# Patient Record
Sex: Female | Born: 1935
Health system: Southern US, Community
[De-identification: ages and names within clinical notes are randomized; demographics above are authoritative.]

## PROBLEM LIST (undated history)

## (undated) DIAGNOSIS — F32A Depression, unspecified: Secondary | ICD-10-CM

## (undated) DIAGNOSIS — R42 Dizziness and giddiness: Secondary | ICD-10-CM

## (undated) DIAGNOSIS — M1990 Unspecified osteoarthritis, unspecified site: Secondary | ICD-10-CM

## (undated) DIAGNOSIS — F0390 Unspecified dementia without behavioral disturbance: Secondary | ICD-10-CM

## (undated) DIAGNOSIS — E039 Hypothyroidism, unspecified: Secondary | ICD-10-CM

## (undated) DIAGNOSIS — I10 Essential (primary) hypertension: Secondary | ICD-10-CM

## (undated) DIAGNOSIS — R443 Hallucinations, unspecified: Secondary | ICD-10-CM

## (undated) DIAGNOSIS — J45909 Unspecified asthma, uncomplicated: Secondary | ICD-10-CM

## (undated) DIAGNOSIS — F329 Major depressive disorder, single episode, unspecified: Secondary | ICD-10-CM

## (undated) DIAGNOSIS — H3530 Unspecified macular degeneration: Secondary | ICD-10-CM

## (undated) DIAGNOSIS — E785 Hyperlipidemia, unspecified: Secondary | ICD-10-CM

## (undated) DIAGNOSIS — C50919 Malignant neoplasm of unspecified site of unspecified female breast: Secondary | ICD-10-CM

## (undated) HISTORY — PX: BREAST SURGERY: SHX581

## (undated) HISTORY — PX: LUMBAR DISC SURGERY: SHX700

## (undated) HISTORY — DX: Malignant neoplasm of unspecified site of unspecified female breast: C50.919

## (undated) HISTORY — DX: Hyperlipidemia, unspecified: E78.5

## (undated) HISTORY — DX: Essential (primary) hypertension: I10

## (undated) HISTORY — DX: Unspecified asthma, uncomplicated: J45.909

## (undated) HISTORY — DX: Unspecified macular degeneration: H35.30

## (undated) HISTORY — DX: Hallucinations, unspecified: R44.3

## (undated) HISTORY — PX: APPENDECTOMY: SHX54

## (undated) HISTORY — DX: Major depressive disorder, single episode, unspecified: F32.9

## (undated) HISTORY — DX: Dizziness and giddiness: R42

## (undated) HISTORY — DX: Depression, unspecified: F32.A

## (undated) HISTORY — DX: Hypothyroidism, unspecified: E03.9

## (undated) HISTORY — PX: ABDOMINAL HYSTERECTOMY: SHX81

---

## 2000-03-07 ENCOUNTER — Encounter: Payer: Self-pay | Admitting: *Deleted

## 2000-03-07 ENCOUNTER — Encounter: Admission: RE | Admit: 2000-03-07 | Discharge: 2000-03-07 | Payer: Self-pay | Admitting: *Deleted

## 2001-04-01 ENCOUNTER — Other Ambulatory Visit: Admission: RE | Admit: 2001-04-01 | Discharge: 2001-04-01 | Payer: Self-pay | Admitting: *Deleted

## 2001-04-03 ENCOUNTER — Encounter: Payer: Self-pay | Admitting: *Deleted

## 2001-04-03 ENCOUNTER — Encounter: Admission: RE | Admit: 2001-04-03 | Discharge: 2001-04-03 | Payer: Self-pay | Admitting: *Deleted

## 2002-06-02 ENCOUNTER — Encounter: Payer: Self-pay | Admitting: Internal Medicine

## 2002-06-02 ENCOUNTER — Encounter: Admission: RE | Admit: 2002-06-02 | Discharge: 2002-06-02 | Payer: Self-pay | Admitting: Internal Medicine

## 2002-06-06 ENCOUNTER — Encounter: Admission: RE | Admit: 2002-06-06 | Discharge: 2002-06-06 | Payer: Self-pay | Admitting: Internal Medicine

## 2002-06-06 ENCOUNTER — Encounter: Payer: Self-pay | Admitting: Internal Medicine

## 2003-01-01 ENCOUNTER — Encounter: Admission: RE | Admit: 2003-01-01 | Discharge: 2003-01-01 | Payer: Self-pay | Admitting: Internal Medicine

## 2003-01-01 ENCOUNTER — Encounter: Payer: Self-pay | Admitting: Internal Medicine

## 2003-06-08 ENCOUNTER — Encounter: Payer: Self-pay | Admitting: Internal Medicine

## 2003-06-08 ENCOUNTER — Ambulatory Visit (HOSPITAL_COMMUNITY): Admission: RE | Admit: 2003-06-08 | Discharge: 2003-06-08 | Payer: Self-pay | Admitting: Internal Medicine

## 2003-06-18 ENCOUNTER — Encounter: Payer: Self-pay | Admitting: Neurological Surgery

## 2003-06-19 ENCOUNTER — Inpatient Hospital Stay (HOSPITAL_COMMUNITY): Admission: RE | Admit: 2003-06-19 | Discharge: 2003-06-20 | Payer: Self-pay | Admitting: Neurological Surgery

## 2003-06-19 ENCOUNTER — Encounter: Payer: Self-pay | Admitting: Neurological Surgery

## 2003-10-19 ENCOUNTER — Encounter: Admission: RE | Admit: 2003-10-19 | Discharge: 2003-10-19 | Payer: Self-pay | Admitting: Neurological Surgery

## 2005-04-28 ENCOUNTER — Ambulatory Visit: Payer: Self-pay | Admitting: Internal Medicine

## 2005-05-12 ENCOUNTER — Ambulatory Visit: Payer: Self-pay | Admitting: Internal Medicine

## 2005-05-18 ENCOUNTER — Ambulatory Visit (HOSPITAL_COMMUNITY): Admission: RE | Admit: 2005-05-18 | Discharge: 2005-05-18 | Payer: Self-pay | Admitting: Internal Medicine

## 2005-06-22 ENCOUNTER — Ambulatory Visit (HOSPITAL_BASED_OUTPATIENT_CLINIC_OR_DEPARTMENT_OTHER): Admission: RE | Admit: 2005-06-22 | Discharge: 2005-06-22 | Payer: Self-pay | Admitting: Orthopedic Surgery

## 2005-06-22 ENCOUNTER — Encounter (INDEPENDENT_AMBULATORY_CARE_PROVIDER_SITE_OTHER): Payer: Self-pay | Admitting: *Deleted

## 2005-06-22 ENCOUNTER — Ambulatory Visit (HOSPITAL_COMMUNITY): Admission: RE | Admit: 2005-06-22 | Discharge: 2005-06-22 | Payer: Self-pay | Admitting: Orthopedic Surgery

## 2005-11-14 ENCOUNTER — Ambulatory Visit (HOSPITAL_COMMUNITY): Admission: RE | Admit: 2005-11-14 | Discharge: 2005-11-14 | Payer: Self-pay | Admitting: Internal Medicine

## 2007-01-29 ENCOUNTER — Encounter: Admission: RE | Admit: 2007-01-29 | Discharge: 2007-01-29 | Payer: Self-pay | Admitting: Internal Medicine

## 2010-03-28 ENCOUNTER — Emergency Department (HOSPITAL_COMMUNITY): Admission: EM | Admit: 2010-03-28 | Discharge: 2010-03-28 | Payer: Self-pay | Admitting: Emergency Medicine

## 2010-12-25 LAB — POCT I-STAT, CHEM 8
BUN: 23 mg/dL (ref 6–23)
Calcium, Ion: 1.19 mmol/L (ref 1.12–1.32)
Chloride: 100 mEq/L (ref 96–112)
HCT: 52 % — ABNORMAL HIGH (ref 36.0–46.0)
Sodium: 140 mEq/L (ref 135–145)

## 2010-12-25 LAB — DIFFERENTIAL
Basophils Absolute: 0.1 10*3/uL (ref 0.0–0.1)
Eosinophils Relative: 9 % — ABNORMAL HIGH (ref 0–5)
Lymphocytes Relative: 33 % (ref 12–46)
Lymphs Abs: 3.9 10*3/uL (ref 0.7–4.0)
Neutro Abs: 5.6 10*3/uL (ref 1.7–7.7)
Neutrophils Relative %: 47 % (ref 43–77)

## 2010-12-25 LAB — URINE MICROSCOPIC-ADD ON

## 2010-12-25 LAB — URINALYSIS, ROUTINE W REFLEX MICROSCOPIC
Glucose, UA: NEGATIVE mg/dL
Ketones, ur: NEGATIVE mg/dL
Nitrite: NEGATIVE
Protein, ur: NEGATIVE mg/dL

## 2010-12-25 LAB — CBC
HCT: 48.6 % — ABNORMAL HIGH (ref 36.0–46.0)
Platelets: 308 10*3/uL (ref 150–400)
RDW: 13.2 % (ref 11.5–15.5)
WBC: 11.8 10*3/uL — ABNORMAL HIGH (ref 4.0–10.5)

## 2010-12-25 LAB — URINE CULTURE
Colony Count: NO GROWTH
Culture: NO GROWTH

## 2010-12-25 LAB — RAPID URINE DRUG SCREEN, HOSP PERFORMED
Amphetamines: NOT DETECTED
Benzodiazepines: NOT DETECTED
Tetrahydrocannabinol: NOT DETECTED

## 2010-12-25 LAB — TSH: TSH: 17.454 u[IU]/mL — ABNORMAL HIGH (ref 0.350–4.500)

## 2010-12-25 LAB — ETHANOL: Alcohol, Ethyl (B): <5 mg/dL (ref 0–10)

## 2010-12-25 LAB — SEDIMENTATION RATE: Sed Rate: 6 mm/hr (ref 0–22)

## 2011-02-24 NOTE — Op Note (Signed)
NAME:  Katherine Roth, Katherine Roth                       ACCOUNT NO.:  [id number]   MEDICAL RECORD NO.:  [id number]                   PATIENT TYPE:  INP   LOCATION:  3009                                 FACILITY:  [facility]   PHYSICIAN:  Tia Alert, MD                  DATE OF BIRTH:  May 14, 1933   DATE OF PROCEDURE:  06/19/2003  DATE OF DISCHARGE:                                 OPERATIVE REPORT   PREOPERATIVE DIAGNOSIS:  Lumbar disk  herniation L3-4 on the left with left  L3 radiculopathy.   POSTOPERATIVE DIAGNOSIS:  Lumbar disk  herniation  L3-4 on the left with  left L3 radiculopathy.   PROCEDURE:  1. Lumbar hemilaminectomy, medial facetectomy and foraminotomy at L3-4 on     the left followed  by microdiskectomy L3-4 on the left.  2. Microdissection.   SURGEON:  Tia Alert, MD   ASSISTANT:  Donalee Citrin, M.D.   ANESTHESIA:  General endotracheal anesthesia.   COMPLICATIONS:  None apparent.   INDICATIONS FOR PROCEDURE:  Ms. Cales is a 77 year old white female who  was referred to the neurosurgical unit with complaints of low back pain with  left leg pain in an L3 distribution. She had an MRI which showed a lumbar  disk  herniation at L3-4 with a free fragment which extended up to the  pedicle level of L3 and sat under the L3 nerve root. She tried medical  management  for quite some time without significant relief. I recommended  lumbar microdiskectomy. She understood the  risks, benefits and alternatives  and wished to proceed.   DESCRIPTION OF PROCEDURE:  The patient was taken to the operating room and  after induction of adequate generalized general endotracheal anesthesia, she  was rolled into the prone position on the Wilson frame and all pressure  points were padded. The lumbar region was prepped with Duraprep and then  draped in the usual sterile fashion.   Then 7 mL of local anesthesia was injected  and then  a dorsal midline  incision was made and was carried down  to the lumbosacral fascia. It was  opened and the paraspinous musculature was taken down in a subperiosteal  fashion to expose the L3-4 interspace on the left side. Interoperative x-ray  confirmed that level.   Then using a Kerrison punch a hemilaminectomy, medial facetectomy and  foraminotomy was performed at L3-4 on the left side, extending the  laminectomy up to just below pedicle level  and the L3 nerve root was  identified after the ligament was opened and removed with the Kerrison punch  to expose the underlying dura and nerve roots.   I was able to palpate the L3 pedicle as well as the L4 pedicle and identify  the L3 and the L4 nerve roots. I used a nerve hook to reach up under the L3  nerve and was able to remove several  small  free fragments from beneath the  axilla of the nerve root. This was all done utilizing microdissection  utilizing the operating microscope.   The annulus was noted to be bulging. She had a subannular disk herniation  also. The annulus was incised with a #15 blade scalpel and a thorough  intradiskal diskectomy was performed also. Once the diskectomy was complete  I used a coronary dilator and a nerve hook to assure there were no more free  fragments or compression of the nerve. The nerve was free and pulsatile.   The wound was irrigated with copious amounts of Bacitracin containing saline  solution. Depo-Medrol was injected over the exposed nerve root. The  retractor was removed and the fascia was closed with interrupted 2-0 Vicryl.  The subcutaneous tissue was closed with interrupted 3-0 Vicryl and the skin  was closed with Dermabond.   The drapes were removed. The patient was rolled into the supine position  and awakened form general endotracheal anesthesia and transferred to the  recovery room in stable condition. At the end of the procedure all sponge,  instrument and needle counts were correct.                                               Tia Alert, MD    DSJ/MEDQ  D:  06/19/2003  T:  06/20/2003  Job:  [id number]

## 2011-02-24 NOTE — Op Note (Signed)
NAMETRISTON, LISANTI             ACCOUNT NO.:  [id number]   MEDICAL RECORD NO.:  [id number]          PATIENT TYPE:  AMB   LOCATION:  [facility]                         FACILITY:  [facility]   PHYSICIAN:  Katy Fitch. Sypher, M.D. DATE OF BIRTH:  August 27, 1933   DATE OF PROCEDURE:  06/22/2005  DATE OF DISCHARGE:                                 OPERATIVE REPORT   PREOP DIAGNOSIS:  Enlarging mass palmar surface of right thumb, flexor  sheath, at the MP joint.   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   OPERATION:  Excisional biopsy of mass volar aspect right thumb.   OPERATING SURGEON:  Katy Fitch. Sypher, M.D.   ASSISTANT:  Molly Maduro Dasnoit PA-C.   ANESTHESIA:  General by LMA.   SUPERVISING ANESTHESIOLOGIST:  Quita Skye. Krista Blue, M.D.   INDICATIONS:  Katherine Roth is a 77 year old woman who is referred through  the courtesy of Dr. Rodrigo Ran for evaluation and management of a mass on  the palmar surface of her right thumb. Clinical examination revealed a  mobile subcutaneous mass overlying the flexor sheath at the proximal thumb  flexion crease, and A1 pulley of the flexor pollicis longus tendon sheath.   This had the feel of a probable giant cell tumor, but could represent a  neural element tumor or tense lipoma.   Plain films are unremarkable except for significant arthritis, pantrapezial  at the base of the thumb.   There were no calcifications at the site of the mass.   After informed consent, Ms. Sachse was brought to the operating room at  this time for excisional biopsy.   PROCEDURE:  Evangela Heffler is brought to the operating room and placed in  supine position on the operating table.   Following the induction of general anesthesia by LMA, the right arm was  prepped with Betadine soap and solution, and sterilely draped.   Following exsanguination of the limb, with an Esmarch bandage, an arterial  tourniquet in the proximal brachium is inflated to 260 mmHg  due to mild  systolic hypertension.   The procedure commenced with a Brunner's zigzag incision. The subcutaneous  tissue was carefully divided, taking care to identify the radial and ulnar  proper digital nerves to the thumb. The mass is circumferentially dissected  and found to be subfascial and adherent to the A1 pulley.   This was circumferentially excised and a rongeur was used to clean the  pulley.   There was no involvement of the proper digital nerves nor the proper digital  arteries.   The wound was then thoroughly irrigated with sterile saline and repaired  with corner suture of 5-0 nylon and interrupted horizontal mattress suture  of 5-0 nylon.   There are no apparent complications.   Ms. Middlesworth tolerated surgery and anesthesia well.   She was awakened from the general anesthesia and transferred to recovery  room with stable vital signs.   For aftercare, she is provided prescription for Darvocet N 100 one p.o. q.4-  6 h. p.r.n. pain 20 tablets without refill.   She will return to our office for  followup in 1 week for dressing change;  and would anticipate suture removal in 7-10 days.      Katy Fitch Sypher, M.D.  Electronically Signed     RVS/MEDQ  D:  06/22/2005  T:  06/22/2005  Job:  [id number]   cc:   Loraine Leriche A. Perini, M.D.  Fax: [phone]

## 2012-07-30 ENCOUNTER — Ambulatory Visit: Payer: Self-pay | Admitting: Neurology

## 2013-01-08 ENCOUNTER — Ambulatory Visit (INDEPENDENT_AMBULATORY_CARE_PROVIDER_SITE_OTHER): Payer: Self-pay | Admitting: Cardiology

## 2013-01-08 DIAGNOSIS — R0989 Other specified symptoms and signs involving the circulatory and respiratory systems: Secondary | ICD-10-CM

## 2013-07-22 ENCOUNTER — Ambulatory Visit (INDEPENDENT_AMBULATORY_CARE_PROVIDER_SITE_OTHER): Payer: Medicare Other | Admitting: Family Medicine

## 2013-07-22 ENCOUNTER — Encounter: Payer: Self-pay | Admitting: Family Medicine

## 2013-07-22 ENCOUNTER — Encounter (INDEPENDENT_AMBULATORY_CARE_PROVIDER_SITE_OTHER): Payer: Self-pay

## 2013-07-22 VITALS — BP 119/59 | HR 59 | Temp 98.1°F | Wt 154.0 lb

## 2013-07-22 DIAGNOSIS — R350 Frequency of micturition: Secondary | ICD-10-CM

## 2013-07-22 DIAGNOSIS — Z8673 Personal history of transient ischemic attack (TIA), and cerebral infarction without residual deficits: Secondary | ICD-10-CM

## 2013-07-22 DIAGNOSIS — M1990 Unspecified osteoarthritis, unspecified site: Secondary | ICD-10-CM | POA: Insufficient documentation

## 2013-07-22 DIAGNOSIS — R5381 Other malaise: Secondary | ICD-10-CM

## 2013-07-22 DIAGNOSIS — M129 Arthropathy, unspecified: Secondary | ICD-10-CM

## 2013-07-22 DIAGNOSIS — R21 Rash and other nonspecific skin eruption: Secondary | ICD-10-CM

## 2013-07-22 DIAGNOSIS — R197 Diarrhea, unspecified: Secondary | ICD-10-CM

## 2013-07-22 LAB — POCT CBC
Granulocyte percent: 76 %G (ref 37–80)
HCT, POC: 40 % (ref 37.7–47.9)
Hemoglobin: 13.8 g/dL (ref 12.2–16.2)
Lymph, poc: 2 (ref 0.6–3.4)
MCH, POC: 29.7 pg (ref 27–31.2)
MCHC: 34.4 g/dL (ref 31.8–35.4)
MCV: 86.3 fL (ref 80–97)
MPV: 6.8 fL (ref 0–99.8)
POC Granulocyte: 9.1 — AB (ref 2–6.9)
POC LYMPH PERCENT: 16.8 %L (ref 10–50)
Platelet Count, POC: 508 10*3/uL — AB (ref 142–424)
RBC: 4.6 M/uL (ref 4.04–5.48)
RDW, POC: 12.3 %
WBC: 12 10*3/uL — AB (ref 4.6–10.2)

## 2013-07-22 LAB — POCT URINALYSIS DIPSTICK
Bilirubin, UA: NEGATIVE
Blood, UA: NEGATIVE
Glucose, UA: NEGATIVE
Ketones, UA: NEGATIVE
Leukocytes, UA: NEGATIVE
Nitrite, UA: POSITIVE
Protein, UA: NEGATIVE
Spec Grav, UA: 1.01
Urobilinogen, UA: NEGATIVE
pH, UA: 5

## 2013-07-22 LAB — POCT UA - MICROSCOPIC ONLY
Casts, Ur, LPF, POC: NEGATIVE
Crystals, Ur, HPF, POC: NEGATIVE
Mucus, UA: NEGATIVE
RBC, urine, microscopic: NEGATIVE
WBC, Ur, HPF, POC: NEGATIVE
Yeast, UA: NEGATIVE

## 2013-07-22 MED ORDER — DIPHENOXYLATE-ATROPINE 2.5-0.025 MG PO TABS: 1.0000 | ORAL_TABLET | Freq: Four times a day (QID) | ORAL | Status: DC | PRN

## 2013-07-22 MED ORDER — NYSTATIN-TRIAMCINOLONE 100000-0.1 UNIT/GM-% EX OINT: TOPICAL_OINTMENT | Freq: Two times a day (BID) | CUTANEOUS | Status: DC

## 2013-07-22 MED ORDER — CIPROFLOXACIN HCL 500 MG PO TABS: 500.0000 mg | ORAL_TABLET | Freq: Two times a day (BID) | ORAL | Status: DC

## 2013-07-22 NOTE — Patient Instructions (Signed)
Needs routine eye exam and glaucoma screen

## 2013-07-22 NOTE — Progress Notes (Signed)
Subjective:    Patient ID: Katherine Roth, female    DOB: 10/15/1932, 79 y.o.   MRN: [id number]  HPI  This 79 y.o. female presents for evaluation of establishment visit. She has hx of dementia And she has been having some diarrhea.  She has been having some urinary frequency. She has been having a shuffling gait and she has been having some perioral fasiculations which Started a year ago.  She was started the respiradol in the evening for auditory and visual hallucinations.  Three months ago.  She has low energy.  She wants a handicap parking sticker.  Review of Systems C/o urinary frequency, altered mental status, tremors, dirrhea, OA, shuffling gait.   No chest pain, SOB, HA, dizziness, vision change, N/V, diarrhea, constipation, dysuria, urinary urgency or frequency, myalgias, arthralgias or rash.  Objective:   Physical Exam  Vital signs noted  Well developed well nourished female.  HEENT - Head atraumatic Normocephalic                Eyes - PERRLA, Conjuctiva - clear Sclera- Clear EOMI                Ears - EAC's Wnl TM's Wnl Gross Hearing WNL                Nose - Nares patent                 Throat - oropharanx wnl Respiratory - Lungs CTA bilateral Cardiac - RRR S1 and S2 without murmur GI - Abdomen soft Nontender and bowel sounds active x 4 Extremities - No edema. Neuro - Fine tremor of hands and peri oral fasiculations.      Results for orders placed in visit on 07/22/13  POCT UA - MICROSCOPIC ONLY      Result Value Range   WBC, Ur, HPF, POC neg     RBC, urine, microscopic neg     Bacteria, U Microscopic tntc     Mucus, UA neg     Epithelial cells, urine per micros occ     Crystals, Ur, HPF, POC neg     Casts, Ur, LPF, POC neg     Yeast, UA neg    POCT URINALYSIS DIPSTICK      Result Value Range   Color, UA yellow     Clarity, UA cloudy     Glucose, UA neg     Bilirubin, UA neg     Ketones, UA neg     Spec Grav, UA 1.010     Blood, UA neg     pH, UA  5.0     Protein, UA neg     Urobilinogen, UA negative     Nitrite, UA pos     Leukocytes, UA Negative    POCT CBC      Result Value Range   WBC 12.0 (*) 4.6 - 10.2 K/uL   Lymph, poc 2.0  0.6 - 3.4   POC LYMPH PERCENT 16.8  10 - 50 %L   POC Granulocyte 9.1 (*) 2 - 6.9   Granulocyte percent 76.0  37 - 80 %G   RBC 4.6  4.04 - 5.48 M/uL   Hemoglobin 13.8  12.2 - 16.2 g/dL   HCT, POC 40.9  81.1 - 47.9 %   MCV 86.3  80 - 97 fL   MCH, POC 29.7  27 - 31.2 pg   MCHC 34.4  31.8 - 35.4 g/dL   RDW, POC 12.3  Platelet Count, POC 508.0 (*) 142 - 424 K/uL   MPV 6.8  0 - 99.8 fL   Assessment & Plan:  Urinary frequency - Plan: POCT UA - Microscopic Only, POCT urinalysis dipstick.  Due to UTI and tx with cipro 500mg  po bid x 7days #14.  Rash and nonspecific skin eruption - Plan: nystatin-triamcinolone ointment (MYCOLOG)  Other malaise and fatigue - Plan: Thyroid Panel With TSH, POCT CBC, CMP14+EGFR, Sedimentation rate, Vitamin B12  Diarrhea - Plan: diphenoxylate-atropine (LOMOTIL) 2.5-0.025 MG per tablet, ciprofloxacin (CIPRO) 500 MG tablet  Arthritis- Handicap parking sticker.  Follow up prn and in 3 months

## 2013-07-22 NOTE — Progress Notes (Signed)
Needs glaucoma screening and routine eye exam

## 2013-07-23 LAB — CMP14+EGFR
ALT: 21 IU/L (ref 0–32)
AST: 31 IU/L (ref 0–40)
Albumin/Globulin Ratio: 1 — ABNORMAL LOW (ref 1.1–2.5)
Albumin: 3.3 g/dL — ABNORMAL LOW (ref 3.5–4.8)
Alkaline Phosphatase: 82 IU/L (ref 39–117)
BUN/Creatinine Ratio: 13 (ref 11–26)
BUN: 13 mg/dL (ref 8–27)
CO2: 28 mmol/L (ref 18–29)
Calcium: 10 mg/dL (ref 8.6–10.2)
Chloride: 95 mmol/L — ABNORMAL LOW (ref 97–108)
Creatinine, Ser: 1.01 mg/dL — ABNORMAL HIGH (ref 0.57–1.00)
GFR calc Af Amer: 62 mL/min/{1.73_m2} (ref >59)
GFR calc non Af Amer: 53 mL/min/{1.73_m2} — ABNORMAL LOW (ref >59)
Globulin, Total: 3.4 g/dL (ref 1.5–4.5)
Glucose: 99 mg/dL (ref 65–99)
Potassium: 3.4 mmol/L — ABNORMAL LOW (ref 3.5–5.2)
Sodium: 139 mmol/L (ref 134–144)
Total Bilirubin: 0.6 mg/dL (ref 0.0–1.2)
Total Protein: 6.7 g/dL (ref 6.0–8.5)

## 2013-07-23 LAB — THYROID PANEL WITH TSH
Free Thyroxine Index: 2.6 (ref 1.2–4.9)
T3 Uptake Ratio: 32 % (ref 24–39)
T4, Total: 8.1 ug/dL (ref 4.5–12.0)
TSH: 3.67 u[IU]/mL (ref 0.450–4.500)

## 2013-07-23 LAB — SEDIMENTATION RATE: Sed Rate: 13 mm/hr (ref 0–40)

## 2013-07-23 LAB — VITAMIN B12: Vitamin B-12: >1999 pg/mL — ABNORMAL HIGH (ref 211–946)

## 2013-08-18 ENCOUNTER — Other Ambulatory Visit: Payer: Self-pay | Admitting: Family Medicine

## 2013-08-21 ENCOUNTER — Telehealth: Payer: Self-pay | Admitting: Family Medicine

## 2013-08-21 NOTE — Telephone Encounter (Signed)
Daughter calling for mom Katherine Roth lab results Lab results given and follow up appt given with bill

## 2013-08-22 ENCOUNTER — Ambulatory Visit (INDEPENDENT_AMBULATORY_CARE_PROVIDER_SITE_OTHER): Payer: Medicare Other | Admitting: Family Medicine

## 2013-08-22 ENCOUNTER — Encounter: Payer: Self-pay | Admitting: Family Medicine

## 2013-08-22 ENCOUNTER — Encounter (INDEPENDENT_AMBULATORY_CARE_PROVIDER_SITE_OTHER): Payer: Self-pay

## 2013-08-22 VITALS — BP 139/78 | HR 54 | Temp 97.9°F | Ht 64.0 in | Wt 154.0 lb

## 2013-08-22 DIAGNOSIS — R251 Tremor, unspecified: Secondary | ICD-10-CM

## 2013-08-22 DIAGNOSIS — R259 Unspecified abnormal involuntary movements: Secondary | ICD-10-CM

## 2013-08-22 DIAGNOSIS — N390 Urinary tract infection, site not specified: Secondary | ICD-10-CM

## 2013-08-22 LAB — POCT URINALYSIS DIPSTICK
Bilirubin, UA: NEGATIVE
Blood, UA: NEGATIVE
Glucose, UA: NEGATIVE
Ketones, UA: NEGATIVE
Leukocytes, UA: NEGATIVE
Nitrite, UA: NEGATIVE
Protein, UA: NEGATIVE
Spec Grav, UA: 1.02
Urobilinogen, UA: NEGATIVE
pH, UA: 6.5

## 2013-08-22 LAB — POCT UA - MICROSCOPIC ONLY
Casts, Ur, LPF, POC: NEGATIVE
Crystals, Ur, HPF, POC: NEGATIVE

## 2013-08-22 MED ORDER — NITROFURANTOIN MONOHYD MACRO 100 MG PO CAPS: 100.0000 mg | ORAL_CAPSULE | Freq: Two times a day (BID) | ORAL | Status: DC

## 2013-08-22 NOTE — Progress Notes (Signed)
Subjective:    Patient ID: Katherine Roth, female    DOB: Sep 27, 1933, 79 y.o.   MRN: [id number]  HPI This 79 y.o. female presents for evaluation of followup on UTI and Fatigue.  She is having persistent tremors that are in both hands and wrists Now.  She is c/o fatigue.  She has hx of polycythemia and sees hematology .   Review of Systems C/o fatigue No chest pain, SOB, HA, dizziness, vision change, N/V, diarrhea, constipation, dysuria, urinary urgency or frequency, myalgias, arthralgias or rash.     Objective:   Physical Exam Vital signs noted  Well developed well nourished female.  HEENT - Head atraumatic Normocephalic                Eyes - PERRLA, Conjuctiva - clear Sclera- Clear EOMI                Ears - EAC's Wnl TM's Wnl Gross Hearing WNL                Nose - Nares patent                 Throat - oropharanx wnl Respiratory - Lungs CTA bilateral Cardiac - RRR S1 and S2 without murmur GI - Abdomen soft Nontender and bowel sounds active x 4 Extremities - No edema. Neuro - Grossly intact.  Results for orders placed in visit on 07/22/13  THYROID PANEL WITH TSH      Result Value Range   TSH 3.670  0.450 - 4.500 uIU/mL   T4, Total 8.1  4.5 - 12.0 ug/dL   T3 Uptake Ratio 32  24 - 39 %   Free Thyroxine Index 2.6  1.2 - 4.9  CMP14+EGFR      Result Value Range   Glucose 99  65 - 99 mg/dL   BUN 13  8 - 27 mg/dL   Creatinine, Ser 8.29 (*) 0.57 - 1.00 mg/dL   GFR calc non Af Amer 53 (*) >59 mL/min/1.73   GFR calc Af Amer 62  >59 mL/min/1.73   BUN/Creatinine Ratio 13  11 - 26   Sodium 139  134 - 144 mmol/L   Potassium 3.4 (*) 3.5 - 5.2 mmol/L   Chloride 95 (*) 97 - 108 mmol/L   CO2 28  18 - 29 mmol/L   Calcium 10.0  8.6 - 10.2 mg/dL   Total Protein 6.7  6.0 - 8.5 g/dL   Albumin 3.3 (*) 3.5 - 4.8 g/dL   Globulin, Total 3.4  1.5 - 4.5 g/dL   Albumin/Globulin Ratio 1.0 (*) 1.1 - 2.5   Total Bilirubin 0.6  0.0 - 1.2 mg/dL   Alkaline Phosphatase 82  39 - 117 IU/L   AST 31  0 - 40 IU/L   ALT 21  0 - 32 IU/L  SEDIMENTATION RATE      Result Value Range   Sed Rate 13  0 - 40 mm/hr  VITAMIN B12      Result Value Range   Vitamin B-12 >1999 (*) 211 - 946 pg/mL  POCT UA - MICROSCOPIC ONLY      Result Value Range   WBC, Ur, HPF, POC neg     RBC, urine, microscopic neg     Bacteria, U Microscopic tntc     Mucus, UA neg     Epithelial cells, urine per micros occ     Crystals, Ur, HPF, POC neg     Casts, Ur, LPF, POC neg  Yeast, UA neg    POCT URINALYSIS DIPSTICK      Result Value Range   Color, UA yellow     Clarity, UA cloudy     Glucose, UA neg     Bilirubin, UA neg     Ketones, UA neg     Spec Grav, UA 1.010     Blood, UA neg     pH, UA 5.0     Protein, UA neg     Urobilinogen, UA negative     Nitrite, UA pos     Leukocytes, UA Negative    POCT CBC      Result Value Range   WBC 12.0 (*) 4.6 - 10.2 K/uL   Lymph, poc 2.0  0.6 - 3.4   POC LYMPH PERCENT 16.8  10 - 50 %L   POC Granulocyte 9.1 (*) 2 - 6.9   Granulocyte percent 76.0  37 - 80 %G   RBC 4.6  4.04 - 5.48 M/uL   Hemoglobin 13.8  12.2 - 16.2 g/dL   HCT, POC 65.7  84.6 - 47.9 %   MCV 86.3  80 - 97 fL   MCH, POC 29.7  27 - 31.2 pg   MCHC 34.4  31.8 - 35.4 g/dL   RDW, POC 96.2     Platelet Count, POC 508.0 (*) 142 - 424 K/uL   MPV 6.8  0 - 99.8 fL       Assessment & Plan:  UTI (urinary tract infection) - Plan: POCT urinalysis dipstick, POCT UA - Microscopic Only, POCT CBC, Urine culture  Tremors of nervous system - Plan: Ambulatory referral to Neurology.  Deatra Canter FNP

## 2013-08-24 LAB — URINE CULTURE

## 2013-08-25 ENCOUNTER — Other Ambulatory Visit: Payer: Self-pay | Admitting: Family Medicine

## 2013-08-26 ENCOUNTER — Encounter: Payer: Self-pay | Admitting: *Deleted

## 2013-08-26 NOTE — Progress Notes (Signed)
Quick Note:  Copy of labs sent to patient ______ 

## 2013-09-25 ENCOUNTER — Encounter (INDEPENDENT_AMBULATORY_CARE_PROVIDER_SITE_OTHER): Payer: Self-pay

## 2013-09-25 ENCOUNTER — Ambulatory Visit (INDEPENDENT_AMBULATORY_CARE_PROVIDER_SITE_OTHER): Payer: Medicare Other | Admitting: Neurology

## 2013-09-25 ENCOUNTER — Encounter: Payer: Self-pay | Admitting: Neurology

## 2013-09-25 VITALS — BP 170/130 | HR 130 | Ht 65.0 in | Wt 209.0 lb

## 2013-09-25 DIAGNOSIS — G20C Parkinsonism, unspecified: Secondary | ICD-10-CM | POA: Insufficient documentation

## 2013-09-25 DIAGNOSIS — G20 Parkinson's disease: Secondary | ICD-10-CM

## 2013-09-25 DIAGNOSIS — R2681 Unsteadiness on feet: Secondary | ICD-10-CM | POA: Insufficient documentation

## 2013-09-25 DIAGNOSIS — R269 Unspecified abnormalities of gait and mobility: Secondary | ICD-10-CM

## 2013-09-25 DIAGNOSIS — R443 Hallucinations, unspecified: Secondary | ICD-10-CM

## 2013-09-25 MED ORDER — CARBIDOPA-LEVODOPA 25-100 MG PO TABS: 1.0000 | ORAL_TABLET | Freq: Three times a day (TID) | ORAL | Status: DC

## 2013-09-25 MED ORDER — QUETIAPINE FUMARATE 25 MG PO TABS: 12.5000 mg | ORAL_TABLET | Freq: Every day | ORAL | Status: DC

## 2013-09-25 NOTE — Patient Instructions (Signed)
Overall you are doing fairly well but I do want to suggest a few things today:   Remember to drink plenty of fluid, eat healthy meals and do not skip any meals. Try to eat protein with a every meal and eat a healthy snack such as fruit or nuts in between meals. Try to keep a regular sleep-wake schedule and try to exercise daily, particularly in the form of walking, 20-30 minutes a day, if you can.   As far as your medications are concerned, I would like to suggest the following: 1)Discontinue the Risperdal and start Seroquel 12.5mg  nightly  2)Start Sinemet 25-100 using the following schedule: -week 1: take 1/2 tablet three times a day at 8am, noon and 4pm for one week -week 2 and on: take 1 tablet three times a day  I would like to see you back in 3 months, sooner if we need to. Please call us with any interim questions, concerns, problems, updates or refill requests.   Please also call us for any test results so we can go over those with you on the phone.  My clinical assistant and will answer any of your questions and relay your messages to me and also relay most of my messages to you.   Our phone number is [phone]. We also have an after hours call service for urgent matters and there is a physician on-call for urgent questions. For any emergencies you know to call 911 or go to the nearest emergency room

## 2013-09-25 NOTE — Progress Notes (Addendum)
[facility]    Provider:  Dr Hosie Poisson Referring Provider: Ernestina Penna, MD Primary Care Physician:  Rudi Heap, MD  CC:  Tremor  HPI:  Katherine Roth is a 79 y.o. female here as a referral from Dr. Christell Constant for tremor  Started 3-4 years ago, had stroke at that time, effected left side of body, (head CT reviewed and shows small caudate lacunar infarct) developed some hallucinations. Due to hallucinations was initially started on fluoxetine and risperidone, shortly after exhibited shaking in R hand and leg. Evaluated by prior PCP who told them it was "tremors" but not PD. In the past few months the symptoms have spread to the left side. Described as predominantly a rest tremor with some action/postural component. Daughter also notes some shuffling of gait, walking slower, some gait instability. Notes some stiffness and aching of her muscles. Having some difficulty swallowing foods, some choking recently noted.   No current hallucinations. Has some difficulty falling asleep, is very restless. No REM behavior disorder noted. No difficulty with sense of smell.   Patients mother had similar shaking too and was told it was Parkinson's.   Has tried going off the risperidone in the past but the hallucinations returned.    Review of Systems: Out of a complete 14 system review, the patient complains of only the following symptoms, and all other reviewed systems are negative. Positive fatigue shortness of breath memory loss confusion headache weakness difficulty swelling tremor insomnia incontinence decreased energy disinterest in activities  History   Social History  . Marital Status: Divorced    Spouse Name: N/A    Number of Children: 5  . Years of Education: college   Occupational History  . Not on file.   Social History Main Topics  . Smoking status: Never Smoker   . Smokeless tobacco: Never Used  . Alcohol Use: No  . Drug Use: No  . Sexual Activity: Not on  file   Other Topics Concern  . Not on file   Social History Narrative   [facility]   Patient is retired.   Patient has 5 children.   Patient has a college education.   Patient is right-handed.   Patient drinks a 8 oz of soda with her dinner.    Family History  Problem Relation Age of Onset  . Heart disease Paternal Grandmother   . Heart disease Mother   . Parkinson's disease Mother   . Heart disease Father   . Hypertension Father   . Osteoporosis Mother     Past Medical History  Diagnosis Date  . Hypertension   . Hyperlipidemia   . Hypothyroidism   . Depression   . SOB (shortness of breath) on exertion   . Dizziness   . Asthma   . Hallucination   . Macular degeneration     Past Surgical History  Procedure Laterality Date  . Lumbar disc surgery    . Appendectomy    . Abdominal hysterectomy      Current Outpatient Prescriptions  Medication Sig Dispense Refill  . aspirin 81 MG tablet Take 81 mg by mouth daily.      Marland Kitchen atenolol-chlorthalidone (TENORETIC) 100-25 MG per tablet Take 1 tablet by mouth daily.      . Cyanocobalamin (VITAMIN B-12 CR PO) Take by mouth.      . donepezil (ARICEPT) 5 MG tablet Take 5 mg by mouth at bedtime as needed.      Marland Kitchen  FLUoxetine (PROZAC) 20 MG tablet Take 20 mg by mouth daily.      Marland Kitchen levothyroxine (SYNTHROID, LEVOTHROID) 125 MCG tablet Take 125 mcg by mouth daily before breakfast.      . losartan (COZAAR) 50 MG tablet TAKE 1 TABLET BY MOUTH EVERY DAY  30 tablet  4  . Misc Natural Products (GLUCOSAMINE CHONDROITIN ADV PO) Take by mouth.      . Multiple Vitamin (MULTIVITAMIN) capsule Take 1 capsule by mouth daily.      . pravastatin (PRAVACHOL) 40 MG tablet Take 40 mg by mouth daily.      . risperiDONE (RISPERDAL) 0.5 MG tablet Take 0.5 mg by mouth daily.       No current facility-administered medications for this visit.    Allergies as of 09/25/2013  . (No Known Allergies)     Vitals: BP 170/130  Pulse 130  Ht 5\' 5"  (1.651 m)  Wt 209 lb (94.802 kg)  BMI 34.78 kg/m2 Last Weight:  Wt Readings from Last 1 Encounters:  09/25/13 209 lb (94.802 kg)   Last Height:   Ht Readings from Last 1 Encounters:  09/25/13 5\' 5"  (1.651 m)     Physical exam: Exam: Gen: NAD, conversant Eyes: anicteric sclerae, moist conjunctivae HENT: Atraumatic, oropharynx clear Neck: Trachea midline; supple,  Lungs: CTA, no wheezing, rales, rhonic                          CV: RRR, no MRG Abdomen: Soft, non-tender;  Extremities: No peripheral edema  Skin: Normal temperature, no rash,  Psych: Appropriate affect, pleasant  Neuro: MS: AA&Ox3, appropriately interactive, normal affect   Speech: limited verbal output  Memory: good recent and remote recall  CN: PERRL, EOMI no nystagmus, no ptosis, sensation intact to LT V1-V3 bilat, face symmetric, no weakness, hearing grossly intact, palate elevates symmetrically, shoulder shrug 5/5 bilat,  tongue protrudes midline, no fasiculations noted.  Motor: normal bulk and  Strength: 5/5  In all extremities  Reflexes: diminished but symmetrical, bilat downgoing toes  Sens: LT intact in all extremities  Brief Motor UPDRS  Speech: hypophonic/dysarthria  Facial Expression: Minimal masked facies  Tremor: Rest R 3 L2 Action/postural R1 L0 Rigidity: Mild cogwheel rigidity RUE Finger taps:   R:2 L:2  Open/close hands: R:2 L:1  Foot taps: R:1 L:1  Gait/FOG: Arises from chair without difficulty, mildly stooped shuffling gait, decreased arm swing bilaterally right greater than left, re\re emergent tremor noted of the right hand. Unable to tandem.   Assessment:  After physical and neurologic examination, review of laboratory studies, imaging, neurophysiology testing and pre-existing records, assessment will be reviewed on the problem list.  Plan:  Treatment plan and additional workup will be reviewed under Problem  List.  1)Parkinsonism 2)Gait instability 3)Hallucinations  [name] is a pleasant 79 year old woman presenting for initial evaluation of bilateral hand tremor. This is predominantly a rest tremor with a mild postural and intention component. Physical exam is also pertinent for generalized bradykinesia, slow shuffling gait with decreased arm swing and re emergent hand tremor.  Patient has been on Risperdal for the past few years. Due to this, it is unclear if her symptoms are secondary to a medication induced parkinsonism or  idiopathic Parkinson's disease. Based on unilateral onset with slower progression would suspect this is likely idiopathic PD and not medication induced. This potential diagnosis was discussed extensively with patient and her daughter. Expressed understanding. Will discontinue Risperdal and start patient on  Seroquel 12.5 mg nightly, can titrate up as needed. Will start patient on low-dose Sinemet, titrate up to 1 tablet of Sinemet 25-100 3 times a day. In the future would consider PT and speech therapy evaluation, will check MOCA at next visit. Followup in 3 months   Elspeth Cho, DO  [facility] [address] [place], [place] [address]  Phone [phone] Fax [phone]

## 2013-09-27 ENCOUNTER — Ambulatory Visit (INDEPENDENT_AMBULATORY_CARE_PROVIDER_SITE_OTHER): Payer: Medicare Other | Admitting: General Practice

## 2013-09-27 VITALS — BP 141/86 | HR 62 | Temp 99.0°F | Ht 65.0 in | Wt 209.0 lb

## 2013-09-27 DIAGNOSIS — L0100 Impetigo, unspecified: Secondary | ICD-10-CM

## 2013-09-27 MED ORDER — MUPIROCIN 2 % EX OINT: 1.0000 "application " | TOPICAL_OINTMENT | Freq: Three times a day (TID) | CUTANEOUS | Status: AC

## 2013-09-27 MED ORDER — CEPHALEXIN 500 MG PO CAPS: 500.0000 mg | ORAL_CAPSULE | Freq: Two times a day (BID) | ORAL | Status: DC

## 2013-09-27 NOTE — Patient Instructions (Signed)
Impetigo Impetigo is an infection of the skin, most common in babies and children.  CAUSES  It is caused by staphylococcal or streptococcal germs (bacteria). Impetigo can start after any damage to the skin. The damage to the skin may be from things like:   Chickenpox.  Scrapes.  Scratches.  Insect bites (common when children scratch the bite).  Cuts.  Nail biting or chewing. Impetigo is contagious. It can be spread from one person to another. Avoid close skin contact, or sharing towels or clothing. SYMPTOMS  Impetigo usually starts out as small blisters or pustules. Then they turn into tiny yellow-crusted sores (lesions).  There may also be:  Large blisters.  Itching or pain.  Pus.  Swollen lymph glands. With scratching, irritation, or non-treatment, these small areas may get larger. Scratching can cause the germs to get under the fingernails; then scratching another part of the skin can cause the infection to be spread there. DIAGNOSIS  Diagnosis of impetigo is usually made by a physical exam. A skin culture (test to grow bacteria) may be done to prove the diagnosis or to help decide the best treatment.  TREATMENT  Mild impetigo can be treated with prescription antibiotic cream. Oral antibiotic medicine may be used in more severe cases. Medicines for itching may be used. HOME CARE INSTRUCTIONS   To avoid spreading impetigo to other body areas:  Keep fingernails short and clean.  Avoid scratching.  Cover infected areas if necessary to keep from scratching.  Gently wash the infected areas with antibiotic soap and water.  Soak crusted areas in warm soapy water using antibiotic soap.  Gently rub the areas to remove crusts. Do not scrub.  Wash hands often to avoid spread this infection.  Keep children with impetigo home from school or daycare until they have used an antibiotic cream for 48 hours (2 days) or oral antibiotic medicine for 24 hours (1 day), and their skin  shows significant improvement.  Children may attend school or daycare if they only have a few sores and if the sores can be covered by a bandage or clothing. SEEK MEDICAL CARE IF:   More blisters or sores show up despite treatment.  Other family members get sores.  Rash is not improving after 48 hours (2 days) of treatment. SEEK IMMEDIATE MEDICAL CARE IF:   You see spreading redness or swelling of the skin around the sores.  You see red streaks coming from the sores.  Your child develops a fever of 100.4 F (37.2 C) or higher.  Your child develops a sore throat.  Your child is acting ill (lethargic, sick to their stomach). Document Released: 09/22/2000 Document Revised: 12/18/2011 Document Reviewed: 07/22/2008 ExitCare Patient Information 2014 [employer].  

## 2013-09-27 NOTE — Progress Notes (Signed)
   Subjective:    Patient ID: Katherine Roth, female    DOB: [birth year], 79 y.o.   MRN: [id number]  Rash This is a new problem. The current episode started yesterday. The problem has been gradually worsening since onset. The affected locations include the right hand. The rash is characterized by blistering, redness and itchiness. It is unknown if there was an exposure to a precipitant. Associated symptoms include a fever. Pertinent negatives include no congestion, cough, shortness of breath or vomiting. Past treatments include nothing. There is no history of allergies, asthma or eczema.      Review of Systems  Constitutional: Positive for fever.  HENT: Negative for congestion.   Respiratory: Negative for cough, chest tightness and shortness of breath.   Gastrointestinal: Negative for vomiting.  Skin: Positive for rash.       Rash to right palm  All other systems reviewed and are negative.       Objective:   Physical Exam  Constitutional: She appears well-developed and well-nourished.  Cardiovascular: Normal rate, regular rhythm and normal heart sounds.   Pulmonary/Chest: Effort normal and breath sounds normal. No respiratory distress. She exhibits no tenderness.  Skin: Skin is warm and dry. Rash noted. There is erythema.  Right palm has erythematous and honey colored crusted, ruptured vesicles x 3.   Psychiatric: She has a normal mood and affect.          Assessment & Plan:  1. Impetigo - mupirocin ointment (BACTROBAN) 2 %; Place 1 application into the nose 3 (three) times daily.  Dispense: 22 g; Refill: 1 - cephALEXin (KEFLEX) 500 MG capsule; Take 1 capsule (500 mg total) by mouth 2 (two) times daily.  Dispense: 20 capsule; Refill: 0 -avoid skin to skin contact of affected area -information sheet provided and discussed (Impetigo) -Patient and daughter verbalized understanding Coralie Keens, FNP-C

## 2013-10-16 ENCOUNTER — Other Ambulatory Visit: Payer: Self-pay | Admitting: Nurse Practitioner

## 2013-10-23 ENCOUNTER — Ambulatory Visit: Payer: Medicare Other | Admitting: Family Medicine

## 2013-10-24 ENCOUNTER — Encounter: Payer: Self-pay | Admitting: Neurology

## 2013-11-12 ENCOUNTER — Telehealth: Payer: Self-pay | Admitting: Family Medicine

## 2013-11-12 NOTE — Telephone Encounter (Signed)
Pt has itchy painful rash appt scheduled

## 2013-11-13 ENCOUNTER — Encounter (INDEPENDENT_AMBULATORY_CARE_PROVIDER_SITE_OTHER): Payer: Self-pay

## 2013-11-13 ENCOUNTER — Encounter: Payer: Self-pay | Admitting: Family Medicine

## 2013-11-13 ENCOUNTER — Ambulatory Visit (INDEPENDENT_AMBULATORY_CARE_PROVIDER_SITE_OTHER): Payer: Medicare HMO | Admitting: Family Medicine

## 2013-11-13 VITALS — BP 156/58 | HR 56 | Temp 99.3°F | Ht 65.0 in | Wt 160.0 lb

## 2013-11-13 DIAGNOSIS — B029 Zoster without complications: Secondary | ICD-10-CM

## 2013-11-13 MED ORDER — HYDROCODONE-ACETAMINOPHEN 5-325 MG PO TABS: 1.0000 | ORAL_TABLET | Freq: Four times a day (QID) | ORAL | Status: DC | PRN

## 2013-11-13 MED ORDER — ACYCLOVIR 800 MG PO TABS: 800.0000 mg | ORAL_TABLET | Freq: Every day | ORAL | Status: DC

## 2013-11-13 MED ORDER — METHYLPREDNISOLONE (PAK) 4 MG PO TABS: ORAL_TABLET | ORAL | Status: DC

## 2013-11-13 NOTE — Progress Notes (Signed)
   Subjective:    Patient ID: Katherine Roth, female    DOB: January 06, 1933, 80 y.o.   MRN: [id number]  HPI  This 80 y.o. female presents for evaluation of rash under her right breast and xyphoid area. She states the rash itches.  Review of Systems    No chest pain, SOB, HA, dizziness, vision change, N/V, diarrhea, constipation, dysuria, urinary urgency or frequency, myalgias, arthralgias or rash.  Objective:   Physical Exam   Vital signs noted  Well developed well nourished female.  HEENT - Head atraumatic Normocephalic Respiratory - Lungs CTA bilateral Cardiac - RRR S1 and S2 without murmur Skin - Erythematous vesicular rash on xyphoid region     Assessment & Plan:  Shingles - Plan: acyclovir (ZOVIRAX) 800 MG tablet, HYDROcodone-acetaminophen (NORCO) 5-325 MG per tablet, methylPREDNIsolone (MEDROL DOSPACK) 4 MG tablet  Lysbeth Penner FNP

## 2013-12-01 ENCOUNTER — Telehealth: Payer: Self-pay | Admitting: *Deleted

## 2013-12-01 MED ORDER — QUETIAPINE FUMARATE 25 MG PO TABS: 25.0000 mg | ORAL_TABLET | Freq: Every day | ORAL | Status: DC

## 2013-12-01 NOTE — Telephone Encounter (Signed)
I called back and spoke with Orlando Penner.  She said by mistake she has been giving the patient one full tab of Seroquel nightly instead of one half.  I asked her how the patient was doing on the medication and she said there has been improvement.  The patient seems more coherent and is no longer hallucinating or hearing things.  Insurance will not pay for a refill at this time unless Rx directions are changed.  Last OV says: start patient on Seroquel 12.5 mg nightly, can titrate up as needed.  Would you like the patient to now stay on 25mg  nightly or increase dose?  I can gladly send a Rx for the dose you would like her to take.  Please advise.  Thank you.

## 2013-12-01 NOTE — Telephone Encounter (Signed)
Rx has been updated and sent.  I called Jerri back.  She is aware the patient should continue taking one full tablet nightly and will follow up with the pharmacy regarding when Rx will be ready.

## 2013-12-01 NOTE — Telephone Encounter (Signed)
Keeping her on a full tablet (25mg ) nightly is fine. Thanks for sending a new prescription.

## 2013-12-02 ENCOUNTER — Encounter (INDEPENDENT_AMBULATORY_CARE_PROVIDER_SITE_OTHER): Payer: Self-pay

## 2013-12-02 ENCOUNTER — Encounter: Payer: Self-pay | Admitting: Family Medicine

## 2013-12-02 ENCOUNTER — Ambulatory Visit (INDEPENDENT_AMBULATORY_CARE_PROVIDER_SITE_OTHER): Payer: Medicare HMO | Admitting: Family Medicine

## 2013-12-02 VITALS — BP 131/66 | HR 60 | Temp 98.5°F | Wt 161.8 lb

## 2013-12-02 DIAGNOSIS — B372 Candidiasis of skin and nail: Secondary | ICD-10-CM

## 2013-12-02 MED ORDER — KETOCONAZOLE 2 % EX CREA: 1.0000 "application " | TOPICAL_CREAM | Freq: Every day | CUTANEOUS | Status: DC

## 2013-12-02 MED ORDER — FLUCONAZOLE 150 MG PO TABS: 150.0000 mg | ORAL_TABLET | Freq: Once | ORAL | Status: DC

## 2013-12-02 MED ORDER — HYDROXYZINE HCL 25 MG PO TABS: 25.0000 mg | ORAL_TABLET | Freq: Three times a day (TID) | ORAL | Status: DC | PRN

## 2013-12-02 NOTE — Progress Notes (Signed)
   Subjective:    Patient ID: Katherine Roth, female    DOB: [birth year], 80 y.o.   MRN: [id number]  HPI  She has a rash on her bilateral breasts for over a week and she is itching.  Review of SystemsC/o rahs No chest pain, SOB, HA, dizziness, vision change, N/V, diarrhea, constipation, dysuria, urinary urgency or frequency, myalgias, arthralgias.     Objective:   Physical Exam  Erythematous Rash across chest and confluent and darker red under breast bilateral.      Assessment & Plan:  Candidal intertrigo - Plan: fluconazole (DIFLUCAN) 150 MG tablet, ketoconazole (NIZORAL) 2 % cream, hydrOXYzine (ATARAX/VISTARIL) 25 MG tablet  Follow up if not better.  Lysbeth Penner FNP

## 2013-12-24 ENCOUNTER — Ambulatory Visit: Payer: Medicare Other | Admitting: Neurology

## 2014-01-12 ENCOUNTER — Other Ambulatory Visit: Payer: Self-pay | Admitting: Family Medicine

## 2014-01-15 ENCOUNTER — Other Ambulatory Visit: Payer: Self-pay | Admitting: Family Medicine

## 2014-01-19 ENCOUNTER — Other Ambulatory Visit: Payer: Self-pay | Admitting: Family Medicine

## 2014-01-30 ENCOUNTER — Ambulatory Visit (INDEPENDENT_AMBULATORY_CARE_PROVIDER_SITE_OTHER): Payer: Medicare HMO | Admitting: General Practice

## 2014-01-30 ENCOUNTER — Ambulatory Visit (INDEPENDENT_AMBULATORY_CARE_PROVIDER_SITE_OTHER): Payer: Medicare HMO

## 2014-01-30 ENCOUNTER — Encounter: Payer: Self-pay | Admitting: General Practice

## 2014-01-30 VITALS — BP 142/72 | HR 87 | Temp 98.9°F | Ht 65.0 in | Wt 159.2 lb

## 2014-01-30 DIAGNOSIS — E785 Hyperlipidemia, unspecified: Secondary | ICD-10-CM

## 2014-01-30 DIAGNOSIS — Z8249 Family history of ischemic heart disease and other diseases of the circulatory system: Secondary | ICD-10-CM

## 2014-01-30 DIAGNOSIS — N63 Unspecified lump in unspecified breast: Secondary | ICD-10-CM

## 2014-01-30 DIAGNOSIS — R0602 Shortness of breath: Secondary | ICD-10-CM

## 2014-01-30 DIAGNOSIS — E039 Hypothyroidism, unspecified: Secondary | ICD-10-CM

## 2014-01-30 DIAGNOSIS — N6310 Unspecified lump in the right breast, unspecified quadrant: Secondary | ICD-10-CM

## 2014-01-30 DIAGNOSIS — I10 Essential (primary) hypertension: Secondary | ICD-10-CM

## 2014-01-30 LAB — POCT CBC
Granulocyte percent: 67.4 %G (ref 37–80)
HCT, POC: 45.3 % (ref 37.7–47.9)
Hemoglobin: 14.8 g/dL (ref 12.2–16.2)
Lymph, poc: 2.6 (ref 0.6–3.4)
MCH, POC: 29 pg (ref 27–31.2)
MCHC: 32.7 g/dL (ref 31.8–35.4)
MCV: 88.9 fL (ref 80–97)
MPV: 8.1 fL (ref 0–99.8)
PLATELET COUNT, POC: 312 10*3/uL (ref 142–424)
POC Granulocyte: 6.5 (ref 2–6.9)
POC LYMPH PERCENT: 26.4 %L (ref 10–50)
RBC: 5.1 M/uL (ref 4.04–5.48)
RDW, POC: 12.9 %
WBC: 9.7 10*3/uL (ref 4.6–10.2)

## 2014-01-30 MED ORDER — LOSARTAN POTASSIUM 50 MG PO TABS: ORAL_TABLET | ORAL | Status: DC

## 2014-01-30 NOTE — Progress Notes (Signed)
Subjective:    Patient ID: Katherine Roth, female    DOB: 06/22/1933, 80 y.o.   MRN: [id number]  HPI Patient presents today to establish care. Her primary care provider was Dr. Perini in [place]. She has moved in with daughter her in [place], [place]. She is accompanied by her daughter who reports patient is being seen by a neurologist. The neurologist prescribed fluoxetine, quetiapine, and carbidopa-Levodopa. She has a history of hypertension, insomnia, hld, hypothyroidism, dementia, and CVA. Taking medications as prescribed.  Reports breast mass, which she reported to daughter two weeks ago. Patient unsure length of time right breast mass has been present. Also reports periodic shortness of breath, with exertion.    Review of Systems  Constitutional: Negative for fever and chills.  Eyes:       Macular degeneration  Respiratory: Negative for chest tightness, shortness of breath and wheezing.   Cardiovascular: Negative for chest pain and palpitations.  Gastrointestinal: Negative for nausea, vomiting, abdominal pain, diarrhea, constipation and blood in stool.  Genitourinary: Negative for dysuria, frequency, vaginal bleeding and difficulty urinating.  Neurological: Positive for tremors. Negative for dizziness, weakness and headaches.  Psychiatric/Behavioral: Negative for suicidal ideas, sleep disturbance and self-injury. The patient is not nervous/anxious.        Objective:   Physical Exam  Constitutional: She appears well-developed and well-nourished.  HENT:  Head: Normocephalic and atraumatic.  Right Ear: External ear normal.  Left Ear: External ear normal.  Mouth/Throat: Oropharynx is clear and moist.  Eyes: EOM are normal.  Neck: Normal range of motion. Neck supple. No thyromegaly present.  Cardiovascular: Normal rate, regular rhythm and normal heart sounds.   Pulmonary/Chest: Effort normal and breath sounds normal. No respiratory distress. She exhibits no tenderness.    Right  breast mass noted at 2' o'clock. 2inch x 1 inch, non mobile.   Abdominal: Soft. Bowel sounds are normal. She exhibits no distension. There is no tenderness.  Lymphadenopathy:    She has no cervical adenopathy.  Neurological: She is alert.  Oriented to self and place  Skin: Skin is warm and dry.  Psychiatric: She has a normal mood and affect.    [facility] reading (PRIMARY) by  E. , FNP-C, no acute process noted of chest xray.       Assessment & Plan:   1. Hypothyroidism  - Thyroid Panel With TSH  2. Hypertension  - POCT CBC - CMP14+EGFR - losartan (COZAAR) 50 MG tablet; TAKE 1 TABLET BY MOUTH EVERY DAY  Dispense: 30 tablet; Refill: 3  3. Hyperlipidemia  - Lipid panel  4. Shortness of breath  - DG Chest 2 View; Future - Ambulatory referral to Cardiology  5. Breast mass, right  - MM Digital Diagnostic Bilat; Future  6. Family history of heart disease - Ambulatory referral to Cardiology -Continue all current medications Labs pending F/u in 3 months Discussed benefits of regular exercise and healthy eating Patient and daughter verbalized understanding  E. , FNP-C    

## 2014-01-30 NOTE — Patient Instructions (Signed)

## 2014-01-31 DIAGNOSIS — N6310 Unspecified lump in the right breast, unspecified quadrant: Secondary | ICD-10-CM | POA: Insufficient documentation

## 2014-01-31 DIAGNOSIS — E785 Hyperlipidemia, unspecified: Secondary | ICD-10-CM | POA: Insufficient documentation

## 2014-01-31 DIAGNOSIS — E039 Hypothyroidism, unspecified: Secondary | ICD-10-CM | POA: Insufficient documentation

## 2014-01-31 DIAGNOSIS — I10 Essential (primary) hypertension: Secondary | ICD-10-CM | POA: Insufficient documentation

## 2014-01-31 LAB — CMP14+EGFR
ALBUMIN: 4 g/dL (ref 3.5–4.8)
ALK PHOS: 60 IU/L (ref 39–117)
ALT: 5 IU/L (ref 0–32)
AST: 21 IU/L (ref 0–40)
Albumin/Globulin Ratio: 1.5 (ref 1.1–2.5)
BUN / CREAT RATIO: 14 (ref 11–26)
BUN: 13 mg/dL (ref 8–27)
CO2: 32 mmol/L — AB (ref 18–29)
Calcium: 10.4 mg/dL — ABNORMAL HIGH (ref 8.7–10.3)
Chloride: 98 mmol/L (ref 97–108)
Creatinine, Ser: 0.91 mg/dL (ref 0.57–1.00)
GFR calc Af Amer: 70 mL/min/{1.73_m2} (ref >59)
GFR calc non Af Amer: 61 mL/min/{1.73_m2} (ref >59)
GLUCOSE: 106 mg/dL — AB (ref 65–99)
Globulin, Total: 2.6 g/dL (ref 1.5–4.5)
Potassium: 3.7 mmol/L (ref 3.5–5.2)
Sodium: 143 mmol/L (ref 134–144)
TOTAL PROTEIN: 6.6 g/dL (ref 6.0–8.5)
Total Bilirubin: 0.5 mg/dL (ref 0.0–1.2)

## 2014-01-31 LAB — LIPID PANEL
Chol/HDL Ratio: 4.4 ratio units (ref 0.0–4.4)
Cholesterol, Total: 144 mg/dL (ref 100–199)
HDL: 33 mg/dL — ABNORMAL LOW (ref >39)
LDL CALC: 48 mg/dL (ref 0–99)
Triglycerides: 314 mg/dL — ABNORMAL HIGH (ref 0–149)
VLDL Cholesterol Cal: 63 mg/dL — ABNORMAL HIGH (ref 5–40)

## 2014-01-31 LAB — THYROID PANEL WITH TSH
Free Thyroxine Index: 2.4 (ref 1.2–4.9)
T3 Uptake Ratio: 31 % (ref 24–39)
T4, Total: 7.9 ug/dL (ref 4.5–12.0)
TSH: 0.864 u[IU]/mL (ref 0.450–4.500)

## 2014-02-06 ENCOUNTER — Other Ambulatory Visit: Payer: Self-pay | Admitting: General Practice

## 2014-02-06 ENCOUNTER — Telehealth: Payer: Self-pay | Admitting: *Deleted

## 2014-02-06 NOTE — Telephone Encounter (Signed)
Message copied by Shelbie Ammons on Fri Feb 06, 2014  4:40 PM ------      Message from: Erby Pian      Created: Fri Feb 06, 2014 11:08 AM       Please inform most labs are wnl. Triglycerides elevated, continue taking cholesterol medication, eating healthier (low fat, baked foods), and some form of regular exercise. Recheck in 3 months if no improvement or worsens will need additional medication. ------

## 2014-02-06 NOTE — Telephone Encounter (Signed)
LM, call for results,mostly normal.

## 2014-02-06 NOTE — Telephone Encounter (Signed)
Pt aware of lab results 

## 2014-02-18 ENCOUNTER — Encounter (HOSPITAL_COMMUNITY): Payer: Self-pay

## 2014-02-25 ENCOUNTER — Ambulatory Visit (HOSPITAL_COMMUNITY)
Admission: RE | Admit: 2014-02-25 | Discharge: 2014-02-25 | Disposition: A | Discharge status: Home / Self Care | Payer: Medicare HMO | Source: Ambulatory Visit | Attending: General Practice | Admitting: General Practice

## 2014-02-25 ENCOUNTER — Other Ambulatory Visit: Payer: Self-pay | Admitting: General Practice

## 2014-02-25 DIAGNOSIS — N63 Unspecified lump in unspecified breast: Secondary | ICD-10-CM | POA: Insufficient documentation

## 2014-02-25 DIAGNOSIS — N6310 Unspecified lump in the right breast, unspecified quadrant: Secondary | ICD-10-CM

## 2014-02-25 DIAGNOSIS — R229 Localized swelling, mass and lump, unspecified: Principal | ICD-10-CM

## 2014-02-25 DIAGNOSIS — IMO0002 Reserved for concepts with insufficient information to code with codable children: Secondary | ICD-10-CM

## 2014-02-27 ENCOUNTER — Other Ambulatory Visit: Payer: Self-pay | Admitting: Nurse Practitioner

## 2014-02-27 DIAGNOSIS — N6310 Unspecified lump in the right breast, unspecified quadrant: Secondary | ICD-10-CM

## 2014-03-04 ENCOUNTER — Ambulatory Visit (HOSPITAL_COMMUNITY): Payer: Medicare HMO

## 2014-03-04 ENCOUNTER — Other Ambulatory Visit: Payer: Self-pay | Admitting: Nurse Practitioner

## 2014-03-04 ENCOUNTER — Ambulatory Visit (HOSPITAL_COMMUNITY): Payer: 59

## 2014-03-04 DIAGNOSIS — R229 Localized swelling, mass and lump, unspecified: Principal | ICD-10-CM

## 2014-03-04 DIAGNOSIS — IMO0002 Reserved for concepts with insufficient information to code with codable children: Secondary | ICD-10-CM

## 2014-03-11 ENCOUNTER — Ambulatory Visit (HOSPITAL_COMMUNITY)
Admission: RE | Admit: 2014-03-11 | Discharge: 2014-03-11 | Disposition: A | Discharge status: Home / Self Care | Payer: Medicare HMO | Source: Ambulatory Visit | Attending: Family Medicine | Admitting: Family Medicine

## 2014-03-11 ENCOUNTER — Encounter (HOSPITAL_COMMUNITY): Payer: Self-pay

## 2014-03-11 ENCOUNTER — Ambulatory Visit (HOSPITAL_COMMUNITY)
Admission: RE | Admit: 2014-03-11 | Discharge: 2014-03-11 | Disposition: A | Discharge status: Home / Self Care | Payer: Medicare HMO | Source: Ambulatory Visit | Attending: Nurse Practitioner | Admitting: Nurse Practitioner

## 2014-03-11 ENCOUNTER — Other Ambulatory Visit: Payer: Self-pay | Admitting: Family Medicine

## 2014-03-11 ENCOUNTER — Other Ambulatory Visit: Payer: Self-pay | Admitting: Nurse Practitioner

## 2014-03-11 DIAGNOSIS — R229 Localized swelling, mass and lump, unspecified: Secondary | ICD-10-CM

## 2014-03-11 DIAGNOSIS — R928 Other abnormal and inconclusive findings on diagnostic imaging of breast: Secondary | ICD-10-CM

## 2014-03-11 DIAGNOSIS — C50919 Malignant neoplasm of unspecified site of unspecified female breast: Secondary | ICD-10-CM | POA: Insufficient documentation

## 2014-03-11 DIAGNOSIS — IMO0002 Reserved for concepts with insufficient information to code with codable children: Secondary | ICD-10-CM

## 2014-03-11 MED ORDER — LIDOCAINE HCL (PF) 2 % IJ SOLN
10.0000 mL | Freq: Once | INTRAMUSCULAR | Status: AC
  Administered 2014-03-11: 10 mL

## 2014-03-11 MED ORDER — LIDOCAINE HCL (PF) 2 % IJ SOLN
INTRAMUSCULAR | Status: AC
  Administered 2014-03-11: 10 mL
  Filled 2014-03-11: qty 10

## 2014-03-11 NOTE — Discharge Instructions (Signed)
Breast Biopsy °Care After °These instructions give you information on caring for yourself after your procedure. Your doctor may also give you more specific instructions. Call your doctor if you have any problems or questions after your procedure. °HOME CARE °· Only take medicine as told by your doctor. °· Do not take aspirin. °· Keep your sutures (stitches) dry when bathing. °· Protect the biopsy area. Do not let the area get bumped. °· Avoid activities that could pull the biopsy site open until your doctor approves. This includes: °· Stretching. °· Reaching. °· Exercise. °· Sports. °· Lifting more than 3lb. °· Continue your normal diet. °· Wear a good support bra for as long as told by your doctor. °· Change any bandages (dressings) as told by your doctor. °· Do not drink alcohol while taking pain medicine. °· Keep all doctor visits as told. Ask when your test results will be ready. Make sure you get your test results. °GET HELP RIGHT AWAY IF:  °· You have a fever. °· You have more bleeding (more than a small spot) from the biopsy site. °· You have trouble breathing. °· You have yellowish-white fluid (pus) coming from the biopsy site. °· You have redness, puffiness (swelling), or more pain in the biopsy site. °· You have a bad smell coming from the biopsy site. °· Your biopsy site opens after sutures, staples, or sticky strips have been removed. °· You have a rash. °· You need stronger medicine. °MAKE SURE YOU: °· Understand these instructions. °· Will watch your condition. °· Will get help right away if you are not doing well or get worse. °Document Released: 07/22/2009 Document Revised: 12/18/2011 Document Reviewed: 11/05/2011 °ExitCare® Patient Information ©2014 [employer]. ° °

## 2014-03-22 ENCOUNTER — Other Ambulatory Visit: Payer: Self-pay | Admitting: Neurology

## 2014-03-24 ENCOUNTER — Encounter: Payer: Self-pay | Admitting: *Deleted

## 2014-03-25 DIAGNOSIS — I10 Essential (primary) hypertension: Secondary | ICD-10-CM | POA: Insufficient documentation

## 2014-03-25 DIAGNOSIS — C50919 Malignant neoplasm of unspecified site of unspecified female breast: Secondary | ICD-10-CM | POA: Insufficient documentation

## 2014-03-25 DIAGNOSIS — R251 Tremor, unspecified: Secondary | ICD-10-CM | POA: Insufficient documentation

## 2014-03-26 ENCOUNTER — Encounter: Payer: Self-pay | Admitting: Internal Medicine

## 2014-03-30 ENCOUNTER — Other Ambulatory Visit: Payer: Self-pay | Admitting: Neurology

## 2014-03-31 NOTE — Telephone Encounter (Signed)
Patient no showed last appt on 03/18

## 2014-04-01 ENCOUNTER — Encounter: Payer: Self-pay | Admitting: Cardiology

## 2014-04-01 ENCOUNTER — Encounter: Payer: Self-pay | Admitting: *Deleted

## 2014-04-01 ENCOUNTER — Ambulatory Visit (INDEPENDENT_AMBULATORY_CARE_PROVIDER_SITE_OTHER): Payer: Commercial Managed Care - HMO | Admitting: Cardiology

## 2014-04-01 ENCOUNTER — Telehealth: Payer: Self-pay | Admitting: Family Medicine

## 2014-04-01 VITALS — BP 157/81 | HR 53 | Ht 68.5 in | Wt 155.0 lb

## 2014-04-01 DIAGNOSIS — R0602 Shortness of breath: Secondary | ICD-10-CM

## 2014-04-01 DIAGNOSIS — Z01810 Encounter for preprocedural cardiovascular examination: Secondary | ICD-10-CM

## 2014-04-01 DIAGNOSIS — I10 Essential (primary) hypertension: Secondary | ICD-10-CM

## 2014-04-01 NOTE — Progress Notes (Signed)
HPI The patient presents for evaluation of dyspnea. She has some memory disorder which makes the conversation somewhat difficult. Her daughter fills in many of the details. She is very sedentary. She does little activity but does do some walking to church stairs occasionally. She reports shortness of breath but cannot recall the details. Of particular note is she has in the last couple of nights had dyspnea at night and her daughter reports that she's had to get up and sit in the chair. She's not otherwise describing orthopnea. She denies any chest pressure, neck or arm discomfort. She's not reporting palpitations, presyncope syncope. It is not clear whether her symptoms have been progressivefor the exact onset of this.  No Known Allergies  Current Outpatient Prescriptions  Medication Sig Dispense Refill  . aspirin 81 MG tablet Take 81 mg by mouth daily.      Marland Kitchen atenolol-chlorthalidone (TENORETIC) 100-25 MG per tablet Take 1 tablet by mouth daily.      . carbidopa-levodopa (SINEMET IR) 25-100 MG per tablet Take 1 tablet by mouth 3 (three) times daily.  270 tablet  3  . donepezil (ARICEPT) 5 MG tablet Take 5 mg by mouth at bedtime as needed.      Marland Kitchen FLUoxetine (PROZAC) 20 MG capsule TAKE 1 CAPSULE EVERY DAY  90 capsule  0  . levothyroxine (SYNTHROID, LEVOTHROID) 125 MCG tablet TAKE 1 TABLET BY MOUTH EVERY DAY  30 tablet  8  . losartan (COZAAR) 50 MG tablet TAKE 1 TABLET BY MOUTH EVERY DAY  30 tablet  3  . Multiple Vitamin (MULTIVITAMIN) capsule Take 1 capsule by mouth daily.      . pravastatin (PRAVACHOL) 40 MG tablet Take 40 mg by mouth daily.      . QUEtiapine (SEROQUEL) 25 MG tablet TAKE 1 TABLET (25 MG TOTAL) BY MOUTH AT BEDTIME.  10 tablet  0  . Cyanocobalamin (VITAMIN B-12 CR PO) Take by mouth.      . hydrOXYzine (ATARAX/VISTARIL) 25 MG tablet Take 1 tablet (25 mg total) by mouth 3 (three) times daily as needed.  30 tablet  1  . Misc Natural Products (GLUCOSAMINE CHONDROITIN ADV PO) Take  by mouth.      . QUEtiapine (SEROQUEL) 25 MG tablet TAKE 1 TABLET (25 MG TOTAL) BY MOUTH AT BEDTIME.  30 tablet  3   No current facility-administered medications for this visit.    Past Medical History  Diagnosis Date  . Hypertension   . Hyperlipidemia   . Hypothyroidism   . Depression   . SOB (shortness of breath) on exertion   . Dizziness   . Asthma   . Hallucination   . Macular degeneration     Past Surgical History  Procedure Laterality Date  . Lumbar disc surgery    . Appendectomy    . Abdominal hysterectomy      ROS:   Positive for headaches, dizziness, occasional diarrhea, swelling, difficulty hearing, early memory disorder. Otherwise as stated in the HPI and negative for all other systems.  PHYSICAL EXAM BP 157/81  Pulse 53  Ht 5' 8.5" (1.74 m)  Wt 155 lb (70.308 kg)  BMI 23.22 kg/m2 GENERAL:  Well appearing HEENT:  Pupils equal round and reactive, fundi not visualized, oral mucosa unremarkable NECK:  No jugular venous distention, waveform within normal limits, carotid upstroke brisk and symmetric, no bruits, no thyromegaly LYMPHATICS:  No cervical, inguinal adenopathy LUNGS:  Clear to auscultation bilaterally BACK:  No CVA tenderness CHEST:  Unremarkable  HEART:  PMI not displaced or sustained,S1 and S2 within normal limits, no S3, no S4, no clicks, no rubs, soft apical systolic murmur non radiation, no murmurs ABD:  Flat, positive bowel sounds normal in frequency in pitch, no bruits, no rebound, no guarding, no midline pulsatile mass, no hepatomegaly, no splenomegaly EXT:  2 plus pulses throughout, no edema, no cyanosis no clubbing SKIN:  No rashes no nodules NEURO:  Cranial nerves II through XII grossly intact, motor grossly intact throughout PSYCH:  Cognitively intact, oriented to person place and time  EKG:  Sinus rhythm, rate 53, axis within normal limits, intervals within normal limits, no acute ST-T wave changes. 04/01/2014   ASSESSMENT AND  PLAN  DYSPNEA:  It is a relatively high possibility of a cardiac etiology. I will start with a BNP level, echocardiogram and stress perfusion study. Further evaluation will be based on these results.  PREOP:  The patients mention that she has not been found to have breast cancer and is to have surgery on July 2. This and also becomes a preoperative evaluation. The above needs to happen before she can be cleared. If she has a negative stress perfusion study she would be at acceptable risk for the planned surgery. I don't know if we can perform these studies before July 2 however

## 2014-04-01 NOTE — Patient Instructions (Signed)
The current medical regimen is effective;  continue present plan and medications.  Please have blood work the same day as your testing (BNP)  Your physician has requested that you have an echocardiogram. Echocardiography is a painless test that uses sound waves to create images of your heart. It provides your doctor with information about the size and shape of your heart and how well your heart's chambers and valves are working. This procedure takes approximately one hour. There are no restrictions for this procedure.  Your physician has requested that you have a lexiscan myoview. For further information please visit [email or web address]. Please follow instruction sheet, as given.

## 2014-04-06 ENCOUNTER — Ambulatory Visit (INDEPENDENT_AMBULATORY_CARE_PROVIDER_SITE_OTHER): Payer: Commercial Managed Care - HMO | Admitting: *Deleted

## 2014-04-06 ENCOUNTER — Ambulatory Visit (HOSPITAL_COMMUNITY): Payer: Medicare HMO | Attending: Cardiovascular Disease | Admitting: Radiology

## 2014-04-06 VITALS — BP 159/80 | Ht 68.5 in | Wt 154.0 lb

## 2014-04-06 DIAGNOSIS — Z01810 Encounter for preprocedural cardiovascular examination: Secondary | ICD-10-CM

## 2014-04-06 DIAGNOSIS — R42 Dizziness and giddiness: Secondary | ICD-10-CM | POA: Insufficient documentation

## 2014-04-06 DIAGNOSIS — R0602 Shortness of breath: Secondary | ICD-10-CM

## 2014-04-06 DIAGNOSIS — R110 Nausea: Secondary | ICD-10-CM

## 2014-04-06 DIAGNOSIS — R0789 Other chest pain: Secondary | ICD-10-CM | POA: Insufficient documentation

## 2014-04-06 DIAGNOSIS — R002 Palpitations: Secondary | ICD-10-CM | POA: Insufficient documentation

## 2014-04-06 DIAGNOSIS — R0989 Other specified symptoms and signs involving the circulatory and respiratory systems: Secondary | ICD-10-CM | POA: Insufficient documentation

## 2014-04-06 DIAGNOSIS — R0609 Other forms of dyspnea: Secondary | ICD-10-CM | POA: Insufficient documentation

## 2014-04-06 DIAGNOSIS — R079 Chest pain, unspecified: Secondary | ICD-10-CM

## 2014-04-06 LAB — BRAIN NATRIURETIC PEPTIDE: PRO B NATRI PEPTIDE: 101 pg/mL — AB (ref 0.0–100.0)

## 2014-04-06 MED ORDER — TECHNETIUM TC 99M SESTAMIBI GENERIC - CARDIOLITE
10.0000 | Freq: Once | INTRAVENOUS | Status: AC | PRN
  Administered 2014-04-06: 10 via INTRAVENOUS

## 2014-04-06 MED ORDER — REGADENOSON 0.4 MG/5ML IV SOLN
0.4000 mg | Freq: Once | INTRAVENOUS | Status: AC
  Administered 2014-04-06: 0.4 mg via INTRAVENOUS

## 2014-04-06 MED ORDER — TECHNETIUM TC 99M SESTAMIBI GENERIC - CARDIOLITE
30.0000 | Freq: Once | INTRAVENOUS | Status: AC | PRN
  Administered 2014-04-06: 30 via INTRAVENOUS

## 2014-04-06 MED ORDER — AMINOPHYLLINE 25 MG/ML IV SOLN
75.0000 mg | Freq: Two times a day (BID) | INTRAVENOUS | Status: DC | PRN
  Administered 2014-04-06: 75 mg via INTRAVENOUS

## 2014-04-06 NOTE — Progress Notes (Signed)
[facility] [address] [place], [place] [address] [phone]    Cardiology Nuclear Med Study  Katherine Roth is a 80 y.o. female     MRN : [id number]     DOB: January 02, 1933  Procedure Date: 04/06/2014  Nuclear Med Background Indication for Stress Test:  Evaluation for Ischemia, and Pending Surgical Clearance for (R) Breast Cancer by Howard-McNatt, MD ([place]) History:  Asthma, COPD and No H/O CAD 04/07/14 ECHO Cardiac Risk Factors: CVA, Family History - CAD, Hypertension and Lipids  Symptoms:  Chest Tightness, Dizziness, DOE, Palpitations and SOB   Nuclear Pre-Procedure Caffeine/Decaff Intake:  None> 12 hrs NPO After: 7:00pm   Lungs:  clear O2 Sat: 95% on room air. IV 0.9% NS with Angio Cath:  22g  IV Site: R Wrist x 1, tolerated well IV Started by:  Irven Baltimore, RN  Chest Size (in):  34 Cup Size: B  Height: 5' 8.5" (1.74 m)  Weight:  154 lb (69.854 kg)  BMI:  Body mass index is 23.07 kg/(m^2). Tech Comments:  Patient took Tenoretic this am Irven Baltimore, Therapist, sports,. Aminophylline 150 mg given IV for symptoms. All symptoms resolved before leaving. S.Williams EMTP     Nuclear Med Study 1 or 2 day study: 1 day  Stress Test Type:  Lexiscan  Reading MD: N/A  Order Authorizing Ramata Strothman:  Minus Breeding, MD  Resting Radionuclide: Technetium 32m Sestamibi  Resting Radionuclide Dose: 11.0 mCi   Stress Radionuclide:  Technetium 10m Sestamibi  Stress Radionuclide Dose: 33.0 mCi           Stress Protocol Rest HR: 51 Stress HR: 71  Rest BP: 159/80 Stress BP: 172  Exercise Time (min): n/a METS: n/a   Predicted Max HR: 142 bpm % Max HR: 50 bpm Rate Pressure Product: 12212   Dose of Adenosine (mg):  n/a Dose of Lexiscan: 0.4 mg  Dose of Atropine (mg): n/a Dose of Dobutamine: n/a mcg/kg/min (at max HR)  Stress Test Technologist: Perrin Maltese, EMT-P  Nuclear Technologist:  Charlton Amor, CNMT     Rest Procedure:  Myocardial perfusion  imaging was performed at rest 45 minutes following the intravenous administration of Technetium 76m Sestamibi. Rest ECG: NSR with non-specific ST-T wave changes  Stress Procedure:  The patient received IV Lexiscan 0.4 mg over 15-seconds.  Technetium 24m Sestamibi injected at 30-seconds. This patient had a headache nausea,sob, and was lt. Headed with the Lexiscan injection. Quantitative spect images were obtained after a 45 minute delay. Stress ECG: No significant change from baseline ECG  QPS Raw Data Images:  Normal; no motion artifact; normal heart/lung ratio. Stress Images:  Normal homogeneous uptake in all areas of the myocardium. Rest Images:  Normal homogeneous uptake in all areas of the myocardium. Subtraction (SDS):  No evidence of ischemia. Transient Ischemic Dilatation (Normal <1.22):  1.14 Lung/Heart Ratio (Normal <0.45):  0.31  Quantitative Gated Spect Images QGS EDV:  78 ml QGS ESV:  26 ml  Impression Exercise Capacity:  Lexiscan with no exercise. BP Response:  Normal blood pressure response. Clinical Symptoms:  No chest pain. ECG Impression:  No significant ST segment change suggestive of ischemia. Comparison with Prior Nuclear Study: No previous nuclear study performed  Overall Impression:  Normal stress nuclear study.  LV Ejection Fraction: 67%.  LV Wall Motion:  NL LV Function; NL Wall Motion  Darlin Coco MD

## 2014-04-07 ENCOUNTER — Other Ambulatory Visit: Payer: Self-pay | Admitting: Family Medicine

## 2014-04-07 ENCOUNTER — Ambulatory Visit (HOSPITAL_COMMUNITY)
Admission: RE | Admit: 2014-04-07 | Discharge: 2014-04-07 | Disposition: A | Discharge status: Home / Self Care | Payer: Medicare HMO | Source: Ambulatory Visit | Attending: Cardiovascular Disease | Admitting: Cardiovascular Disease

## 2014-04-07 ENCOUNTER — Telehealth: Payer: Self-pay | Admitting: Neurology

## 2014-04-07 ENCOUNTER — Other Ambulatory Visit: Payer: 59

## 2014-04-07 DIAGNOSIS — R0602 Shortness of breath: Secondary | ICD-10-CM

## 2014-04-07 DIAGNOSIS — R0609 Other forms of dyspnea: Secondary | ICD-10-CM | POA: Insufficient documentation

## 2014-04-07 DIAGNOSIS — R0989 Other specified symptoms and signs involving the circulatory and respiratory systems: Secondary | ICD-10-CM | POA: Insufficient documentation

## 2014-04-07 DIAGNOSIS — I369 Nonrheumatic tricuspid valve disorder, unspecified: Secondary | ICD-10-CM

## 2014-04-07 NOTE — Telephone Encounter (Signed)
Patient's daughter calling for refills of patient's medications Seroquel and fluoxetine. Daughter stated the patient has recently been diagnosed with breast cancer and they have been going to multiple appointments for this.

## 2014-04-07 NOTE — Telephone Encounter (Signed)
It appears Dr Sallyanne Havers is prescribing Fluoxetine.  We sent Seroquel refill on 06/15.  I called [facility], they said they did get the Rx form 06/15 and will fill it now.  I called the daughter back.  She is aware and will follow up with other MD regarding Prozac.

## 2014-04-07 NOTE — Progress Notes (Signed)
2D Echocardiogram Complete.  04/07/2014   Rubbie Goostree, RDCS

## 2014-04-08 HISTORY — PX: MASTECTOMY: SHX3

## 2014-04-16 ENCOUNTER — Encounter (HOSPITAL_COMMUNITY): Payer: Self-pay

## 2014-05-06 ENCOUNTER — Ambulatory Visit (INDEPENDENT_AMBULATORY_CARE_PROVIDER_SITE_OTHER): Payer: Medicare HMO | Admitting: Family

## 2014-05-06 ENCOUNTER — Telehealth: Payer: Self-pay | Admitting: Neurology

## 2014-05-06 ENCOUNTER — Telehealth: Payer: Self-pay | Admitting: Family Medicine

## 2014-05-06 ENCOUNTER — Encounter: Payer: Self-pay | Admitting: Family

## 2014-05-06 VITALS — BP 111/59 | HR 64 | Temp 98.7°F | Ht 68.5 in | Wt 155.4 lb

## 2014-05-06 DIAGNOSIS — B372 Candidiasis of skin and nail: Secondary | ICD-10-CM

## 2014-05-06 DIAGNOSIS — IMO0001 Reserved for inherently not codable concepts without codable children: Secondary | ICD-10-CM

## 2014-05-06 DIAGNOSIS — N390 Urinary tract infection, site not specified: Secondary | ICD-10-CM

## 2014-05-06 DIAGNOSIS — R350 Frequency of micturition: Secondary | ICD-10-CM

## 2014-05-06 LAB — POCT UA - MICROSCOPIC ONLY
CASTS, UR, LPF, POC: NEGATIVE
Crystals, Ur, HPF, POC: NEGATIVE
Mucus, UA: NEGATIVE

## 2014-05-06 LAB — POCT URINALYSIS DIPSTICK
Bilirubin, UA: NEGATIVE
GLUCOSE UA: NEGATIVE
Ketones, UA: NEGATIVE
NITRITE UA: NEGATIVE
PH UA: 5
Protein, UA: NEGATIVE
Spec Grav, UA: 1.025
Urobilinogen, UA: NEGATIVE

## 2014-05-06 MED ORDER — CIPROFLOXACIN HCL 500 MG PO TABS: 500.0000 mg | ORAL_TABLET | Freq: Two times a day (BID) | ORAL | Status: DC

## 2014-05-06 MED ORDER — NYSTATIN-TRIAMCINOLONE 100000-0.1 UNIT/GM-% EX OINT: 1.0000 "application " | TOPICAL_OINTMENT | Freq: Two times a day (BID) | CUTANEOUS | Status: DC

## 2014-05-06 NOTE — Progress Notes (Signed)
   Subjective:    Patient ID: Katherine Roth, female    DOB: [birth year], 80 y.o.   MRN: [id number]  Urinary Tract Infection  This is a new problem. The current episode started yesterday. The problem occurs intermittently. The problem has been waxing and waning. The quality of the pain is described as burning. The pain is at a severity of 6/10. The pain is mild. There has been no fever. Associated symptoms include frequency and urgency. Pertinent negatives include no chills, discharge, flank pain, hematuria or hesitancy. She has tried nothing for the symptoms.   *Daughter brought pt in. States the pt was been confused and disorientated that started last night.   Review of Systems  Constitutional: Negative.  Negative for chills.  HENT: Negative.   Eyes: Negative.   Respiratory: Negative.  Negative for shortness of breath.   Cardiovascular: Negative.  Negative for palpitations.  Gastrointestinal: Negative.   Endocrine: Negative.   Genitourinary: Positive for urgency and frequency. Negative for hesitancy, hematuria and flank pain.  Musculoskeletal: Negative.   Neurological: Negative.  Negative for headaches.  Hematological: Negative.   Psychiatric/Behavioral: Negative.   All other systems reviewed and are negative.      Objective:   Physical Exam  Vitals reviewed. Constitutional: She is oriented to person, place, and time. She appears well-developed and well-nourished. No distress.  Cardiovascular: Normal rate, regular rhythm, normal heart sounds and intact distal pulses.   No murmur heard. Pulmonary/Chest: Effort normal and breath sounds normal. No respiratory distress. She has no wheezes.  Abdominal: Soft. Bowel sounds are normal. She exhibits no distension. There is tenderness (lower abd pain).  Musculoskeletal: Normal range of motion. She exhibits no edema and no tenderness.  Negative for CVA tenderness   Neurological: She is alert and oriented to person, place, and time. She  has normal reflexes. No cranial nerve deficit.  Skin: Skin is warm and dry.  Psychiatric: She has a normal mood and affect. Her behavior is normal. Judgment and thought content normal.      BP 111/59  Pulse 64  Temp(Src) 98.7 F (37.1 C) (Oral)  Ht 5' 8.5" (1.74 m)  Wt 155 lb 6.4 oz (70.489 kg)  BMI 23.28 kg/m2     Assessment & Plan:  1. Frequency - POCT urinalysis dipstick - POCT UA - Microscopic Only  2. Urinary tract infection, site not specified -Force fluids AZO over the counter X2 days RTO prn - ciprofloxacin (CIPRO) 500 MG tablet; Take 1 tablet (500 mg total) by mouth 2 (two) times daily.  Dispense: 10 tablet; Refill: 0  3. Candidal skin infection - nystatin-triamcinolone ointment (MYCOLOG); Apply 1 application topically 2 (two) times daily.  Dispense: 30 g; Refill: 0  Evelina Dun, FNP

## 2014-05-06 NOTE — Telephone Encounter (Signed)
Appt given for today 

## 2014-05-06 NOTE — Patient Instructions (Signed)

## 2014-05-06 NOTE — Telephone Encounter (Signed)
Returned call. Instructed her to bring patient to PCP to get a UA and CBC. Instructed her that she can use 1/2 tablet of Seroquel during the daytime for increased agitation as needed.

## 2014-05-06 NOTE — Telephone Encounter (Signed)
Katherine Roth, daughter and Health care POA called and stated patient has experienced hallucinations where she's seeing and hearing things.   It started early am, but assumed she had a bad dream, stated patient still hallucinating today.  She gives patient  QUEtiapine (SEROQUEL) 25 MG tablet and FLUoxetine (PROZAC) 20 MG capsule at bedtime and has worked for several years.  Daughter questioning if patient needs medication adjustment.  Please return call and may leave message on vm if not available.

## 2014-05-06 NOTE — Telephone Encounter (Signed)
Daughter says patient has been hallucinating all day.  She currently takes Seroquel and Fluoxetine (Prescribed by Dr Sallyanne Havers).  Daughter would like to know if the medication dose should be adjusted.  Please advise.  Thank you.

## 2014-06-15 ENCOUNTER — Other Ambulatory Visit: Payer: Self-pay | Admitting: Nurse Practitioner

## 2014-06-22 ENCOUNTER — Ambulatory Visit (INDEPENDENT_AMBULATORY_CARE_PROVIDER_SITE_OTHER): Payer: Medicare HMO | Admitting: Family Medicine

## 2014-06-22 ENCOUNTER — Encounter: Payer: Self-pay | Admitting: Family Medicine

## 2014-06-22 VITALS — BP 167/63 | HR 53 | Temp 98.1°F | Ht 68.5 in | Wt 155.0 lb

## 2014-06-22 DIAGNOSIS — N3000 Acute cystitis without hematuria: Secondary | ICD-10-CM

## 2014-06-22 DIAGNOSIS — N390 Urinary tract infection, site not specified: Secondary | ICD-10-CM

## 2014-06-22 DIAGNOSIS — F29 Unspecified psychosis not due to a substance or known physiological condition: Secondary | ICD-10-CM

## 2014-06-22 DIAGNOSIS — R410 Disorientation, unspecified: Secondary | ICD-10-CM

## 2014-06-22 LAB — POCT UA - MICROSCOPIC ONLY
Casts, Ur, LPF, POC: NEGATIVE
Crystals, Ur, HPF, POC: NEGATIVE
Mucus, UA: NEGATIVE
Yeast, UA: NEGATIVE

## 2014-06-22 LAB — POCT URINALYSIS DIPSTICK
Bilirubin, UA: NEGATIVE
Glucose, UA: NEGATIVE
Ketones, UA: NEGATIVE
Leukocytes, UA: NEGATIVE
Nitrite, UA: NEGATIVE
Spec Grav, UA: 1.025
Urobilinogen, UA: NEGATIVE
pH, UA: 5

## 2014-06-22 MED ORDER — RISPERIDONE 0.25 MG PO TABS: 0.2500 mg | ORAL_TABLET | Freq: Every day | ORAL | Status: DC

## 2014-06-22 MED ORDER — NITROFURANTOIN MONOHYD MACRO 100 MG PO CAPS: 100.0000 mg | ORAL_CAPSULE | Freq: Two times a day (BID) | ORAL | Status: DC

## 2014-06-22 NOTE — Progress Notes (Signed)
   Subjective:    Patient ID: Katherine Roth, female    DOB: Oct 27, 1932, 80 y.o.   MRN: [id number]  HPI  This 80 y.o. female presents for evaluation of confusion and urinary frequency.  She has had right mastectomy due to right breast cancer.  She has been having some delerium and she is sundowning. She finished a week of cipro a week ago.  Review of Systems C/o UA sx's and sundowning   No chest pain, SOB, HA, dizziness, vision change, N/V, diarrhea, constipation, dysuria, urinary urgency or frequency, myalgias, arthralgias or rash.  Objective:   Physical Exam Vital signs noted  Well developed well nourished female.  HEENT - Head atraumatic Normocephalic Respiratory - Lungs CTA bilateral Cardiac - RRR S1 and S2 without murmur GI - Abdomen soft Nontender and bowel sounds active x 4  Results for orders placed in visit on 06/22/14  POCT UA - MICROSCOPIC ONLY      Result Value Ref Range   WBC, Ur, HPF, POC 1-3     RBC, urine, microscopic 1-3     Bacteria, U Microscopic occ     Mucus, UA neg     Epithelial cells, urine per micros few     Crystals, Ur, HPF, POC neg     Casts, Ur, LPF, POC neg     Yeast, UA neg    POCT URINALYSIS DIPSTICK      Result Value Ref Range   Color, UA gold     Clarity, UA clear     Glucose, UA neg     Bilirubin, UA neg     Ketones, UA neg     Spec Grav, UA 1.025     Blood, UA trace     pH, UA 5.0     Protein, UA trace     Urobilinogen, UA negative     Nitrite, UA neg     Leukocytes, UA Negative         Assessment & Plan:  Urinary tract infection, site not specified - Plan: POCT UA - Microscopic Only, POCT urinalysis dipstick  Acute cystitis without hematuria - Plan: nitrofurantoin, macrocrystal-monohydrate, (MACROBID) 100 MG capsule, Urine culture  Confusion - Plan: risperiDONE (RISPERDAL) 0.25 MG tablet po hs and discussed not sleeping during day and sleeping at night to get back on schedule.  Lysbeth Penner FNP

## 2014-06-24 ENCOUNTER — Other Ambulatory Visit: Payer: Self-pay | Admitting: Family Medicine

## 2014-06-24 LAB — URINE CULTURE

## 2014-06-25 ENCOUNTER — Telehealth: Payer: Self-pay | Admitting: Family Medicine

## 2014-06-25 NOTE — Telephone Encounter (Signed)
Message copied by Waverly Ferrari on Thu Jun 25, 2014 10:16 AM ------      Message from: Lysbeth Penner      Created: Wed Jun 24, 2014  5:43 PM       UA cx shows UTI and is sensitive to cipro ------

## 2014-06-25 NOTE — Telephone Encounter (Signed)
Daughter aware.

## 2014-07-01 IMAGING — US US RT BREAST BX W LOC DEV 1ST LESION IMG BX SPEC US GUIDE
1 series · 6 of 6 positions shown · non-contrast
Comparison: Previous exams.

ADDENDUM:
Pathology results: Pathology results from the suspicious mass in the
right breast at 12 o'clock revealed invasive ductal carcinoma. This
is concordant with the imaging findings. The patient's Mamani, Evelane, has been notified of the results. She is doing well
and denies any biopsy site complications.

The patient's daughter prefers to be referred to [employer]
where she works, and the nurse navigator will contact Ms. Nivirus
tomorrow with an appointment date and time. The patient does meet
criteria for breast MRI, although speaking with the patient's
daughter the patient may not be able to tolerate being in the
scanner on the prone position for a prolonged period of time. This
can be scheduled at the discretion of the patient's breast surgeon.
She has been instructed to call the [REDACTED] with any questions
or concerns.
CLINICAL DATA: 80-year-old female with a highly suspicious mass in
the right breast at 12 o'clock.
EXAM:
ULTRASOUND GUIDED RIGHT BREAST CORE NEEDLE BIOPSY

[Series 1: us right breast bx w loc dev 1st lesion img bx spe · 0.06mm/px · 6 of 6 slices shown]
[im 1/6]
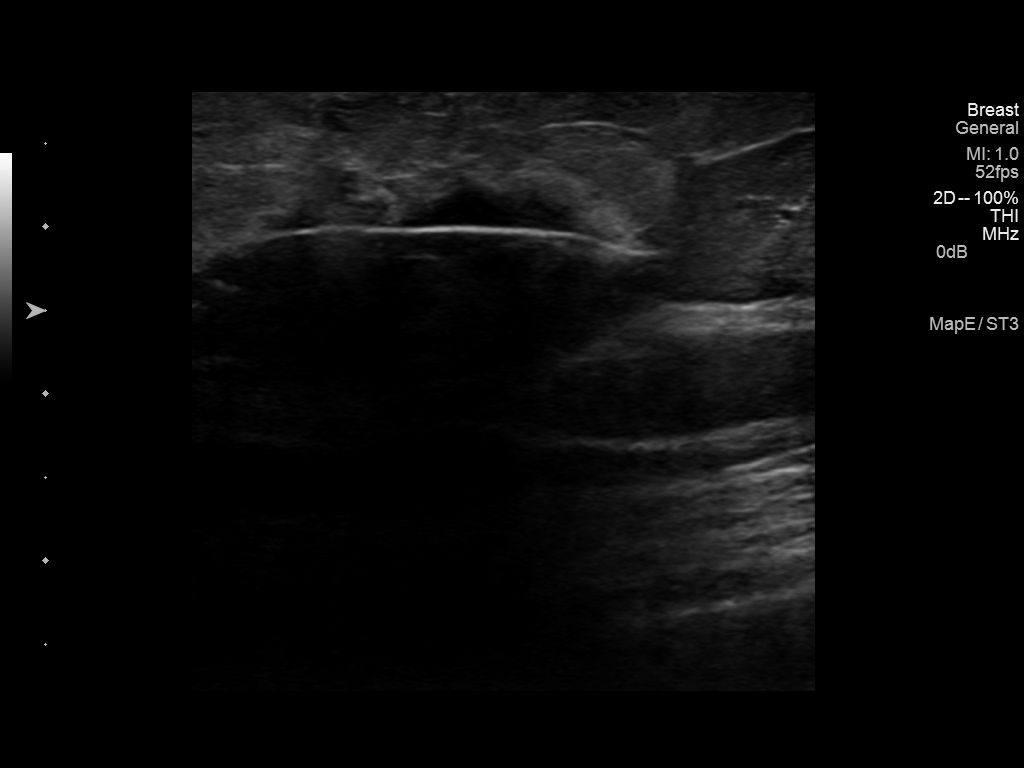
[im 2/6]
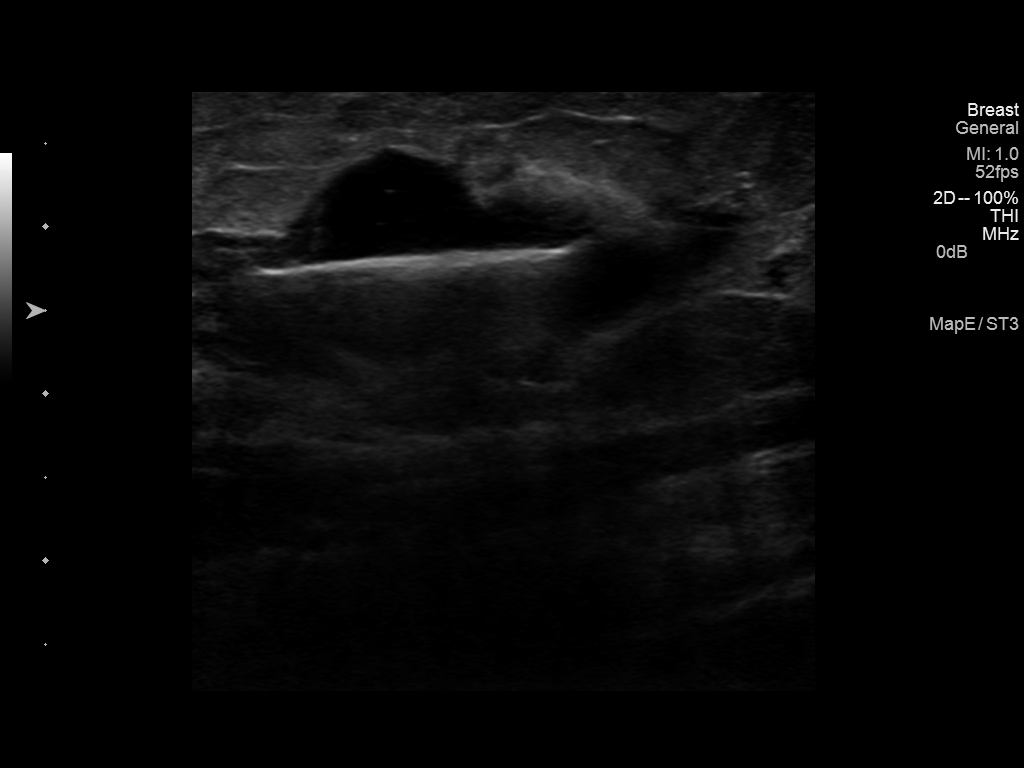
[im 3/6]
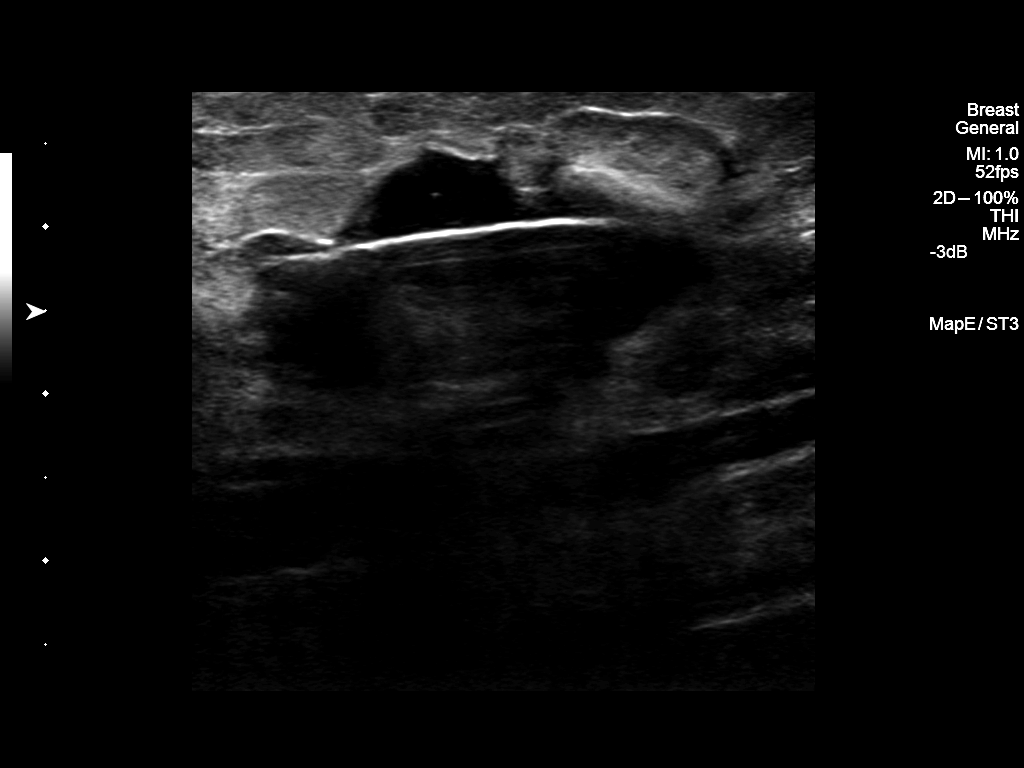
[im 4/6]
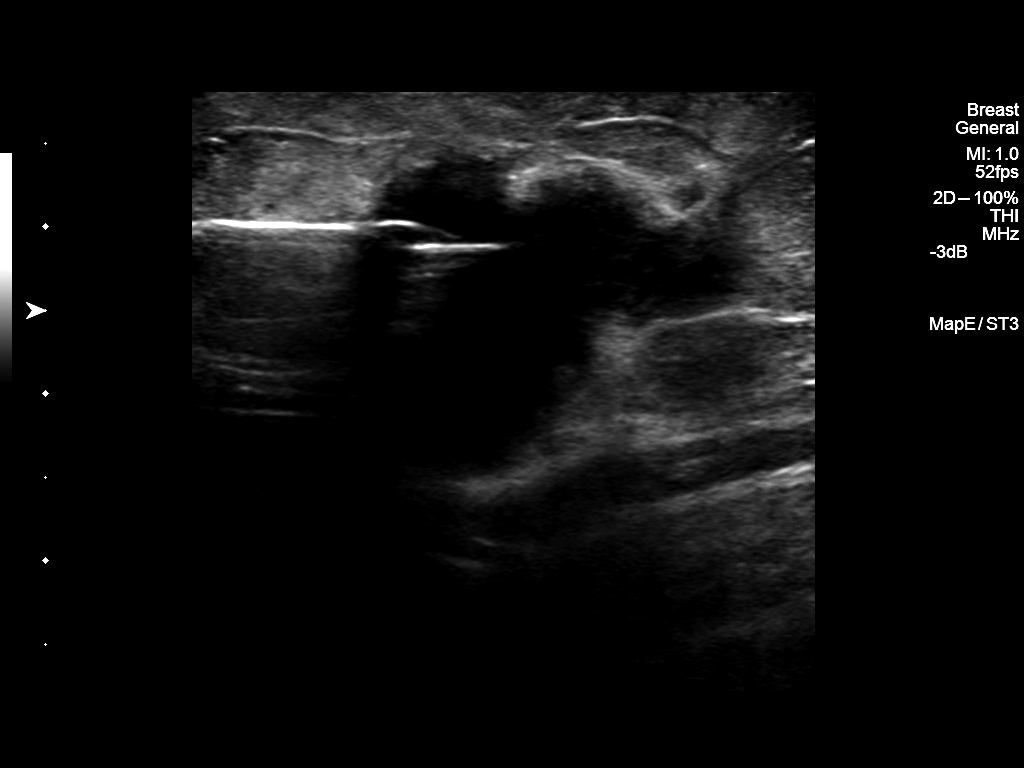
[im 5/6]
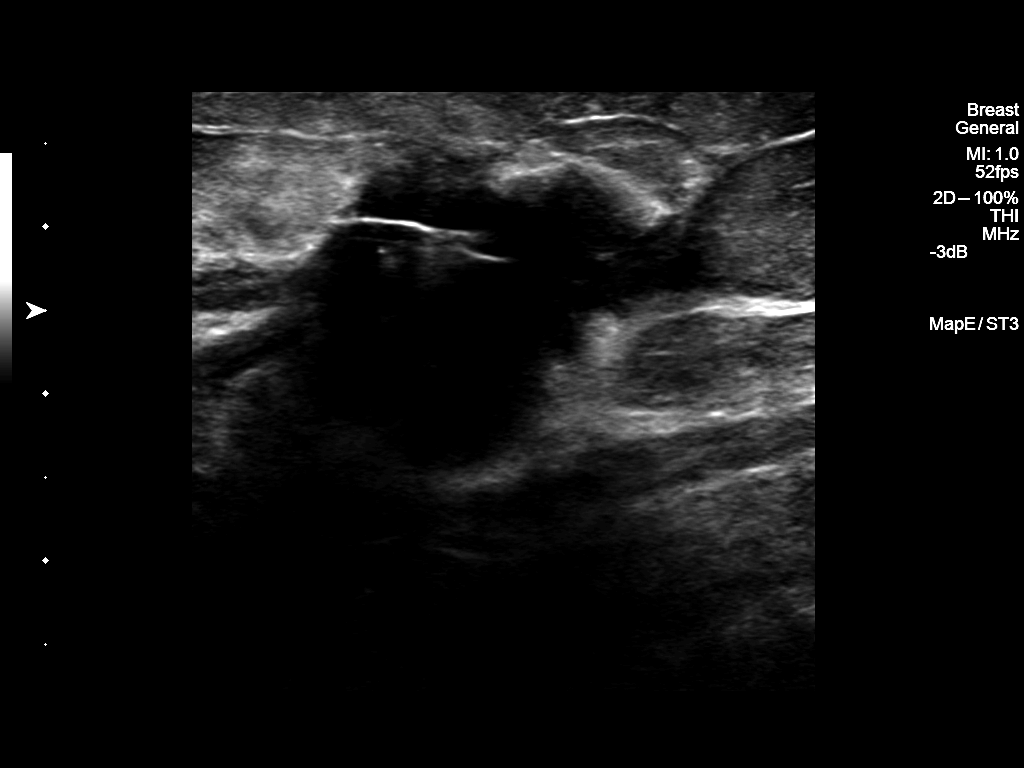
[im 6/6]
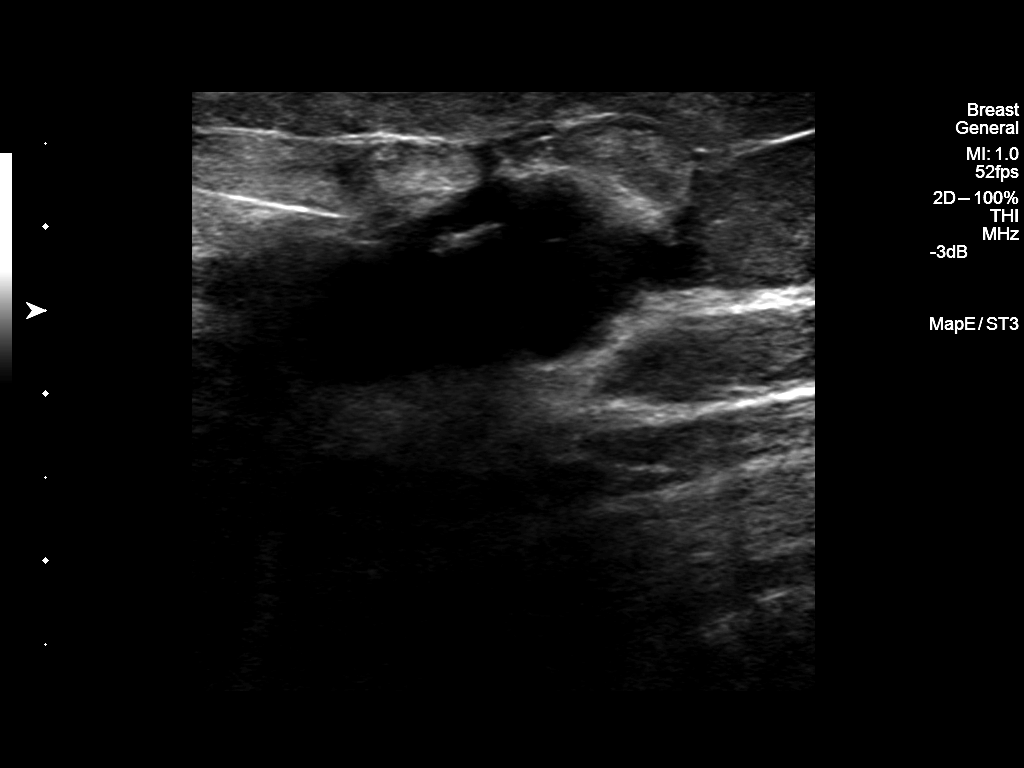

[6 of 6 positions shown; findings below may reference images not displayed]

PROCEDURE:
I met with the patient and we discussed the procedure of
ultrasound-guided biopsy, including benefits and alternatives. We
discussed the high likelihood of a successful procedure. We
discussed the risks of the procedure including infection, bleeding,
tissue injury, clip migration, and inadequate sampling. Informed
written consent was given. The usual time-out protocol was performed
immediately prior to the procedure.

Using sterile technique and 2% Lidocaine as local anesthetic, under
direct ultrasound visualization, a 12 gauge vacuum-assisteddevice
was used to perform biopsy of a highly suspicious mass in the right
breast at 12 o'clock using a lateral approach. At the conclusion of
the procedure, a ribbon shaped tissue marker clip was deployed into
the biopsy cavity. Follow-up 2-view mammogram was performed and
dictated separately.
IMPRESSION: Ultrasound-guided biopsy of a highly suspicious mass in the upper
right breast at 12 o'clock. No apparent complications.

## 2014-07-01 IMAGING — MG MM DIGITAL DIAGNOSTIC UNILAT*R*
2 series · 2 of 2 positions shown · non-contrast
Comparison: Previous exams

CLINICAL DATA: Post biopsy of a suspicious right breast mass at 12
o'clock.

EXAM:
DIAGNOSTIC RIGHT MAMMOGRAM POST ULTRASOUND BIOPSY

[R CC]
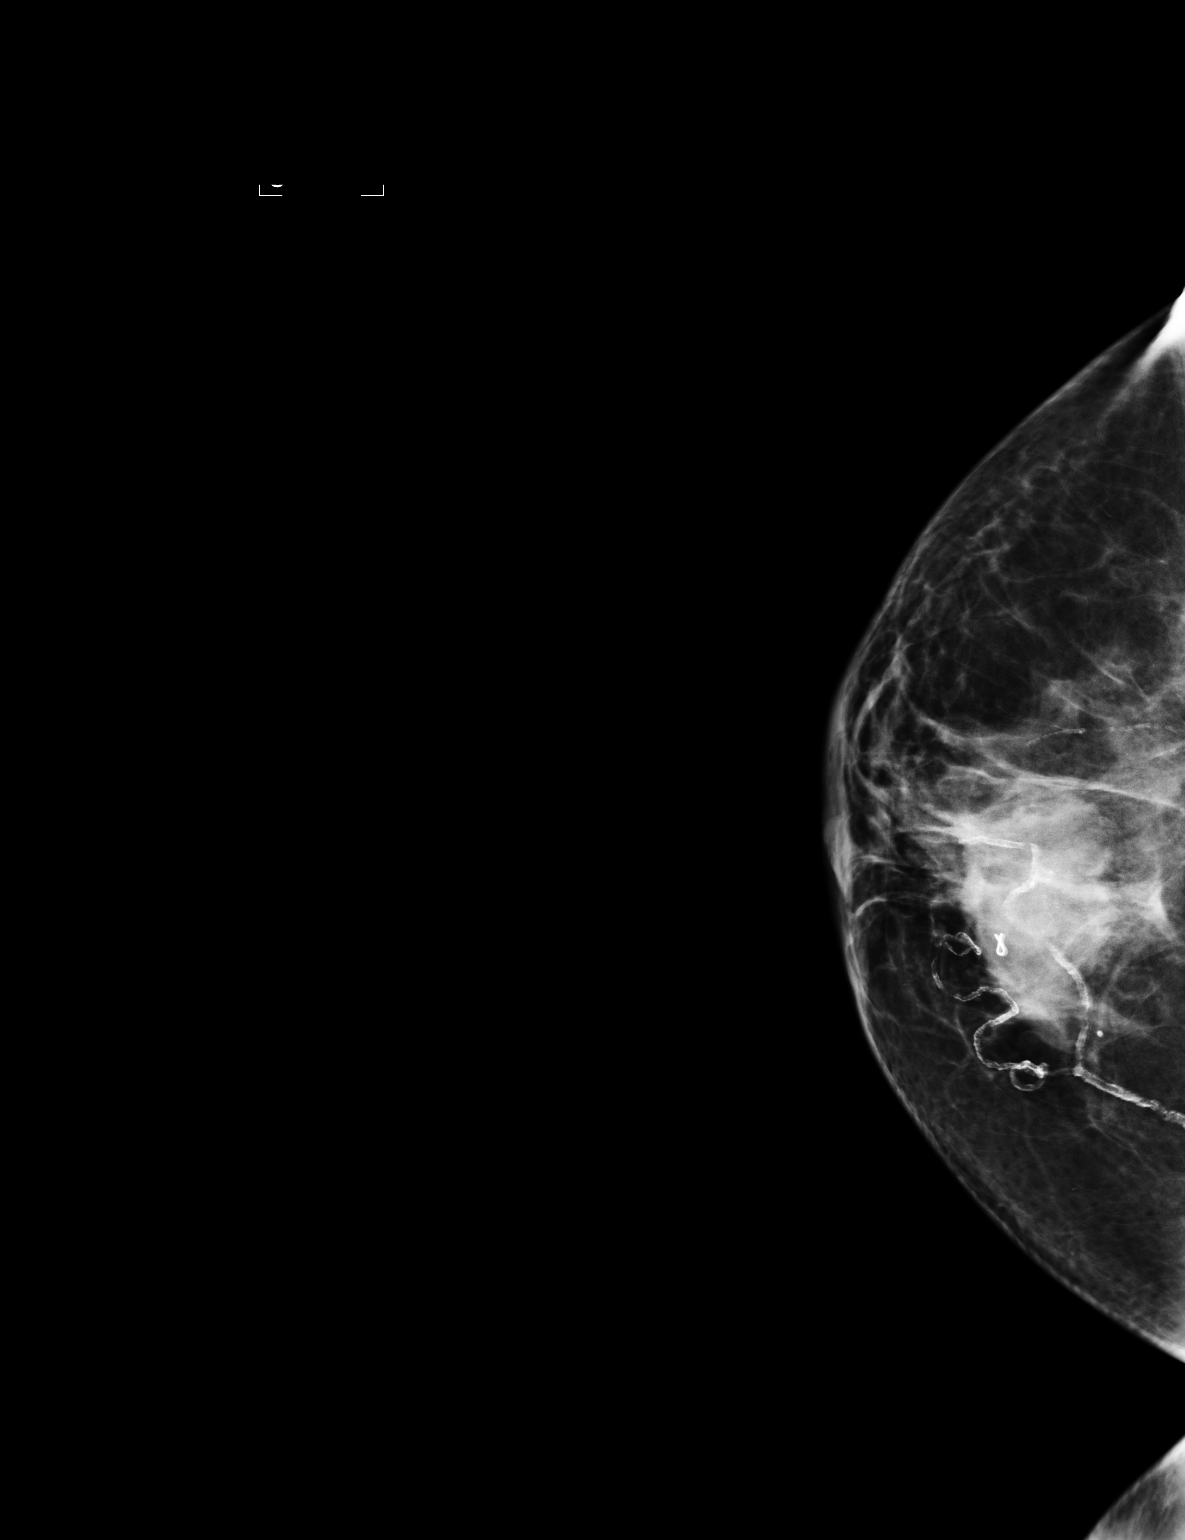

[R ML]
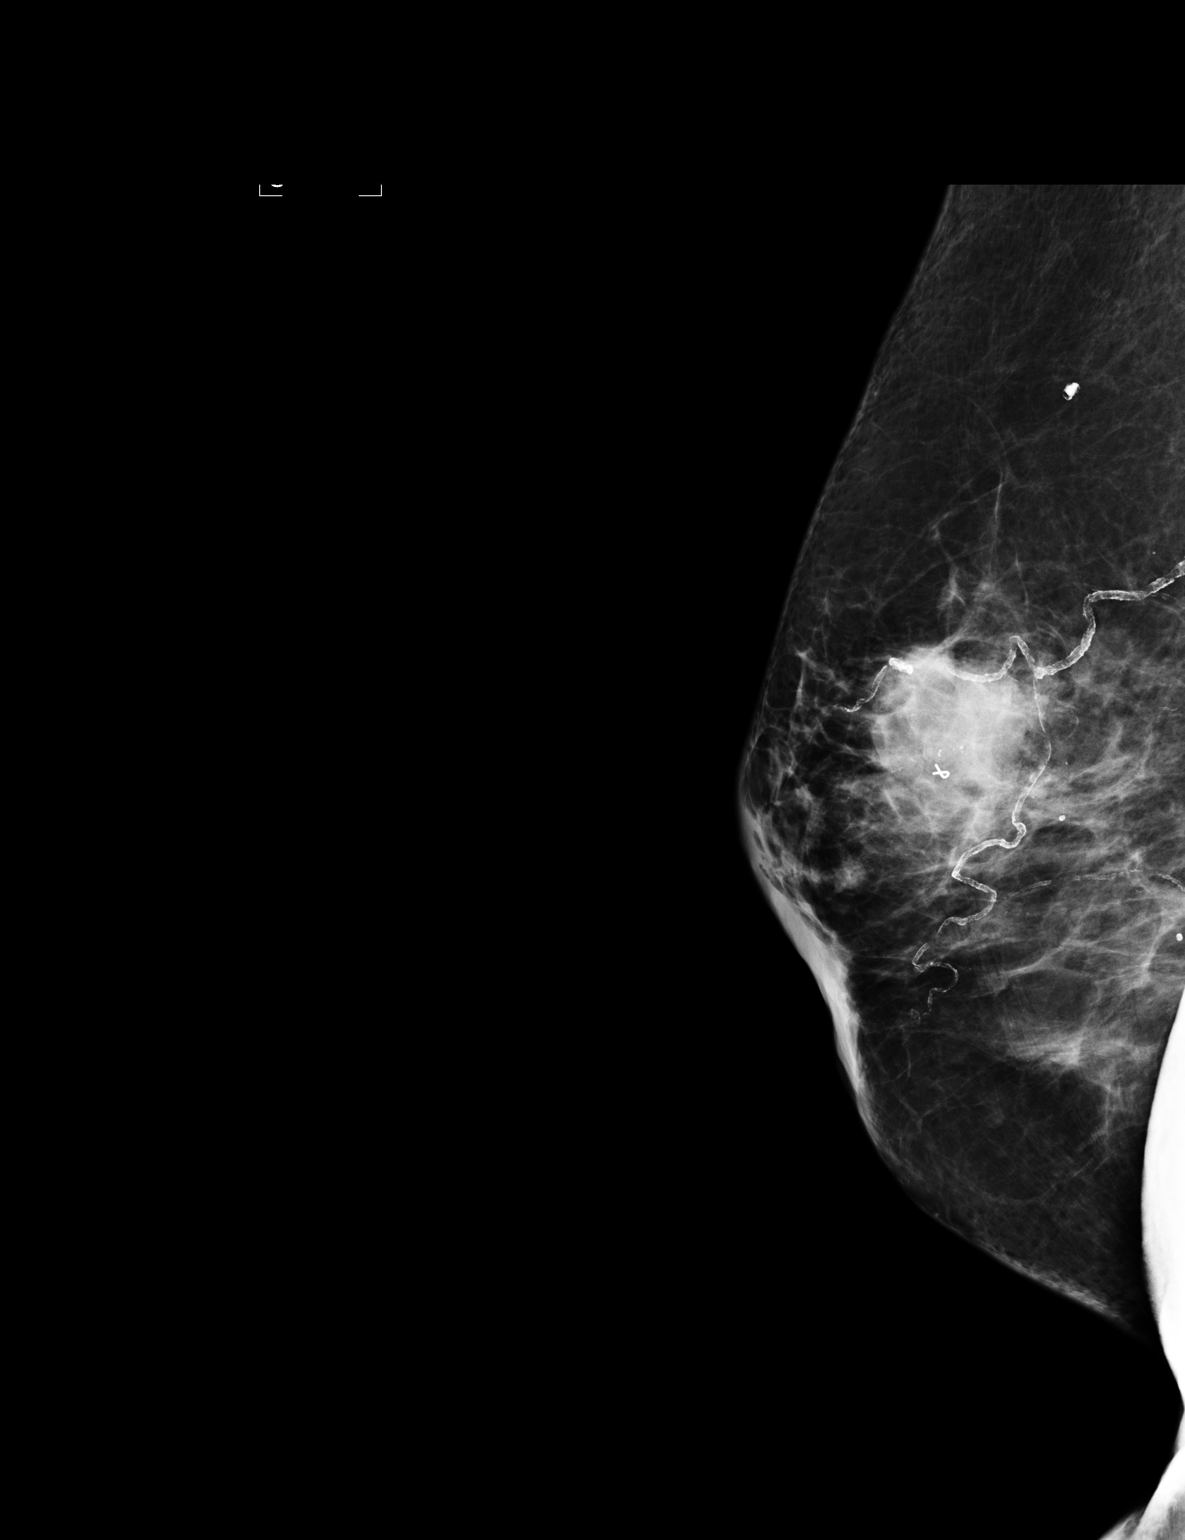

[2 of 2 positions shown; findings below may reference images not displayed]

FINDINGS: Mammographic images were obtained following ultrasound guided biopsy
of and the mass in the right breast at 12 o'clock. A ribbon shaped
biopsy marking clip is present in the targeted region of the mass.
IMPRESSION: Appropriate positioning of ribbon shaped biopsy marking clip post
biopsy of a suspicious mass in the right breast at 12 o'clock.

Final Assessment: Post Procedure Mammograms for Marker Placement

## 2014-07-14 ENCOUNTER — Other Ambulatory Visit: Payer: Self-pay | Admitting: Nurse Practitioner

## 2014-07-15 ENCOUNTER — Telehealth: Payer: Self-pay | Admitting: Family Medicine

## 2014-07-15 ENCOUNTER — Other Ambulatory Visit: Payer: Self-pay | Admitting: Family Medicine

## 2014-07-15 NOTE — Telephone Encounter (Signed)
no more refills without being seen  

## 2014-07-15 NOTE — Telephone Encounter (Signed)
Rx is at pharmacy. LMOVM to have pt to call back. She needs to be seen before another refill.

## 2014-07-15 NOTE — Telephone Encounter (Signed)
Meds sent to pharmacy. Patients daughter notified

## 2014-07-27 ENCOUNTER — Other Ambulatory Visit: Payer: Self-pay | Admitting: Family Medicine

## 2014-07-28 ENCOUNTER — Telehealth: Payer: Self-pay | Admitting: Nurse Practitioner

## 2014-07-28 NOTE — Telephone Encounter (Signed)
Last seen 06/22/14 [facility] [place]

## 2014-07-31 ENCOUNTER — Other Ambulatory Visit: Payer: Self-pay | Admitting: Neurology

## 2014-08-01 NOTE — Telephone Encounter (Signed)
Rx auth via [facility] since Dr Janann Colonel left the practice

## 2014-08-06 NOTE — Telephone Encounter (Signed)
Patients daughter has been called several times and unable to reach her. Patient has appt scheduled for 11/5

## 2014-08-11 ENCOUNTER — Other Ambulatory Visit: Payer: Self-pay | Admitting: Nurse Practitioner

## 2014-08-13 ENCOUNTER — Ambulatory Visit (INDEPENDENT_AMBULATORY_CARE_PROVIDER_SITE_OTHER): Payer: Medicare HMO | Admitting: Nurse Practitioner

## 2014-08-13 ENCOUNTER — Encounter: Payer: Self-pay | Admitting: Nurse Practitioner

## 2014-08-13 VITALS — BP 116/67 | HR 58 | Temp 98.5°F | Ht 65.0 in | Wt 154.0 lb

## 2014-08-13 DIAGNOSIS — R443 Hallucinations, unspecified: Secondary | ICD-10-CM

## 2014-08-13 DIAGNOSIS — G4700 Insomnia, unspecified: Secondary | ICD-10-CM

## 2014-08-13 NOTE — Progress Notes (Signed)
   Subjective:    Patient ID: Katherine Roth, female    DOB: 04-06-1933, 80 y.o.   MRN: [id number]  HPI Patient brought in by son-in-law for evaluation- They are having trouble with her memory and she is seeing people that aren't there-packing clothes in the middle of the night. Doesn't know where she is sometimes- They want to have her put in nursing home because they are not able to leave her alone and they can't always be there- they brought paper work to be filled [place].    Review of Systems  Constitutional: Negative.   HENT: Negative.   Eyes: Negative.   Respiratory: Negative.   Cardiovascular: Negative.   Genitourinary: Negative.   Neurological: Negative.   Psychiatric/Behavioral: Negative.   All other systems reviewed and are negative.      Objective:   Physical Exam  Constitutional: She is oriented to person, place, and time. She appears well-developed and well-nourished.  Cardiovascular: Normal rate and normal heart sounds.   Pulmonary/Chest: Effort normal and breath sounds normal.  Neurological: She is alert and oriented to person, place, and time.  Skin: Skin is warm.  Psychiatric: She has a normal mood and affect. Her behavior is normal. Judgment and thought content normal.  Doesn't answer all questions appropriately   BP 116/67 mmHg  Pulse 58  Temp(Src) 98.5 F (36.9 C) (Oral)  Ht 5\' 5"  (1.651 m)  Wt 154 lb (69.854 kg)  BMI 25.63 kg/m2         Assessment & Plan:  1. Insomnia Try to keep awake during the day time so can sleep better at night Try giving melatonin oTC at night to see if helps   2. Hallucinations Don't think that meds are causing problems- Will fill out paper work foir nursing home. Should not leave patient home alone  Mary-Margaret Hassell Done, FNP

## 2014-08-18 ENCOUNTER — Encounter: Payer: Self-pay | Admitting: *Deleted

## 2014-08-21 ENCOUNTER — Ambulatory Visit (INDEPENDENT_AMBULATORY_CARE_PROVIDER_SITE_OTHER): Payer: Medicare HMO

## 2014-08-21 DIAGNOSIS — Z111 Encounter for screening for respiratory tuberculosis: Secondary | ICD-10-CM

## 2014-08-24 LAB — TB SKIN TEST
INDURATION: 0 mm
TB Skin Test: NEGATIVE

## 2014-08-27 ENCOUNTER — Other Ambulatory Visit: Payer: Self-pay | Admitting: Family Medicine

## 2014-09-29 ENCOUNTER — Other Ambulatory Visit: Payer: Self-pay | Admitting: Neurology

## 2014-09-29 NOTE — Telephone Encounter (Signed)
Patient no showed last appt  

## 2014-09-30 NOTE — Telephone Encounter (Signed)
Called again, got no answer.  Left message.

## 2014-10-01 ENCOUNTER — Other Ambulatory Visit: Payer: Self-pay | Admitting: Family Medicine

## 2014-10-01 NOTE — Telephone Encounter (Signed)
Auth temp supply via [facility] since Dr Janann Colonel left the practice so patient is not without medication over the holiday weekend.

## 2014-10-01 NOTE — Telephone Encounter (Signed)
Last seen 08/13/14  MMM

## 2014-10-03 ENCOUNTER — Other Ambulatory Visit: Payer: Self-pay | Admitting: Neurology

## 2014-10-05 NOTE — Telephone Encounter (Signed)
Called patient, got no answer.  Mailbox id full.  Unable to leave message.

## 2014-10-06 NOTE — Telephone Encounter (Signed)
Called again, got no answer.  Mailbox full.  Unable to leave message.

## 2014-10-07 NOTE — Telephone Encounter (Signed)
I called again.  Got no answer.  Mailbox full, unable to leave message.

## 2014-10-08 NOTE — Telephone Encounter (Signed)
Sent small supply so patient is not without meds over holiday weekend

## 2014-10-08 NOTE — Telephone Encounter (Signed)
I called again.  Mailbox still full.  Unable to leave message.

## 2014-10-19 ENCOUNTER — Ambulatory Visit (INDEPENDENT_AMBULATORY_CARE_PROVIDER_SITE_OTHER): Payer: Medicare HMO | Admitting: Family

## 2014-10-19 ENCOUNTER — Encounter: Payer: Self-pay | Admitting: Family

## 2014-10-19 VITALS — BP 161/75 | HR 57 | Temp 97.4°F | Ht 65.0 in | Wt 149.0 lb

## 2014-10-19 DIAGNOSIS — R410 Disorientation, unspecified: Secondary | ICD-10-CM

## 2014-10-19 DIAGNOSIS — N390 Urinary tract infection, site not specified: Secondary | ICD-10-CM

## 2014-10-19 LAB — POCT URINALYSIS DIPSTICK
Bilirubin, UA: NEGATIVE
GLUCOSE UA: NEGATIVE
Ketones, UA: NEGATIVE
Leukocytes, UA: NEGATIVE
Nitrite, UA: POSITIVE
PH UA: 6
Protein, UA: NEGATIVE
Spec Grav, UA: 1.02
Urobilinogen, UA: NEGATIVE

## 2014-10-19 LAB — POCT UA - MICROSCOPIC ONLY
CRYSTALS, UR, HPF, POC: NEGATIVE
Casts, Ur, LPF, POC: NEGATIVE
Mucus, UA: NEGATIVE
Yeast, UA: NEGATIVE

## 2014-10-19 MED ORDER — SULFAMETHOXAZOLE-TRIMETHOPRIM 800-160 MG PO TABS: 1.0000 | ORAL_TABLET | Freq: Two times a day (BID) | ORAL | Status: DC

## 2014-10-19 NOTE — Progress Notes (Signed)
   Subjective:    Patient ID: Katherine Roth, female    DOB: 1933/09/02, 81 y.o.   MRN: [id number]  Urinary Tract Infection  This is a new problem. The current episode started in the past 7 days. The problem occurs intermittently. The problem has been waxing and waning. The quality of the pain is described as burning. The pain is mild. There has been no fever. Pertinent negatives include no chills, discharge, nausea, urgency or vomiting. Associated symptoms comments: Pt having a change in behavior and confused  . She has tried nothing for the symptoms. The treatment provided mild relief.      Review of Systems  Constitutional: Negative.  Negative for chills.  HENT: Negative.   Eyes: Negative.   Respiratory: Negative.  Negative for shortness of breath.   Cardiovascular: Negative.  Negative for palpitations.  Gastrointestinal: Negative.  Negative for nausea and vomiting.  Endocrine: Negative.   Genitourinary: Negative.  Negative for urgency.  Musculoskeletal: Negative.   Neurological: Negative.  Negative for headaches.  Hematological: Negative.   Psychiatric/Behavioral: Negative.   All other systems reviewed and are negative.      Objective:   Physical Exam  Constitutional: She is oriented to person, place, and time. She appears well-developed and well-nourished. No distress.  Eyes: Pupils are equal, round, and reactive to light.  Neck: Normal range of motion. Neck supple. No thyromegaly present.  Cardiovascular: Normal rate, regular rhythm, normal heart sounds and intact distal pulses.   No murmur heard. Pulmonary/Chest: Effort normal and breath sounds normal. No respiratory distress. She has no wheezes.  Abdominal: Soft. Bowel sounds are normal. She exhibits no distension. There is no tenderness.  Musculoskeletal: Normal range of motion. She exhibits no edema or tenderness.  Neurological: She is alert and oriented to person, place, and time. She has normal reflexes. No  cranial nerve deficit.  Skin: Skin is warm and dry.  Psychiatric: She has a normal mood and affect. Her behavior is normal. Judgment and thought content normal.  Vitals reviewed.     BP 161/75 mmHg  Pulse 57  Temp(Src) 97.4 F (36.3 C) (Oral)  Ht 5\' 5"  (1.651 m)  Wt 149 lb (67.586 kg)  BMI 24.79 kg/m2     Assessment & Plan:  1. Disorientated - POCT urinalysis dipstick - POCT UA - Microscopic Only - sulfamethoxazole-trimethoprim (BACTRIM DS,SEPTRA DS) 800-160 MG per tablet; Take 1 tablet by mouth 2 (two) times daily.  Dispense: 10 tablet; Refill: 0  2. Urinary tract infection without hematuria, site unspecified Force fluids AZO over the counter X2 days RTO prn - sulfamethoxazole-trimethoprim (BACTRIM DS,SEPTRA DS) 800-160 MG per tablet; Take 1 tablet by mouth 2 (two) times daily.  Dispense: 10 tablet; Refill: 0  Evelina Dun, FNP

## 2014-10-19 NOTE — Patient Instructions (Signed)

## 2014-11-09 DIAGNOSIS — G20 Parkinson's disease: Secondary | ICD-10-CM | POA: Diagnosis not present

## 2014-11-10 DIAGNOSIS — G20 Parkinson's disease: Secondary | ICD-10-CM | POA: Diagnosis not present

## 2014-11-11 DIAGNOSIS — G20 Parkinson's disease: Secondary | ICD-10-CM | POA: Diagnosis not present

## 2014-11-12 DIAGNOSIS — G20 Parkinson's disease: Secondary | ICD-10-CM | POA: Diagnosis not present

## 2014-11-13 DIAGNOSIS — G20 Parkinson's disease: Secondary | ICD-10-CM | POA: Diagnosis not present

## 2014-11-14 DIAGNOSIS — G20 Parkinson's disease: Secondary | ICD-10-CM | POA: Diagnosis not present

## 2014-11-15 DIAGNOSIS — G20 Parkinson's disease: Secondary | ICD-10-CM | POA: Diagnosis not present

## 2014-11-16 DIAGNOSIS — G20 Parkinson's disease: Secondary | ICD-10-CM | POA: Diagnosis not present

## 2014-11-17 DIAGNOSIS — G20 Parkinson's disease: Secondary | ICD-10-CM | POA: Diagnosis not present

## 2014-11-18 DIAGNOSIS — G20 Parkinson's disease: Secondary | ICD-10-CM | POA: Diagnosis not present

## 2014-11-19 DIAGNOSIS — G20 Parkinson's disease: Secondary | ICD-10-CM | POA: Diagnosis not present

## 2014-11-20 DIAGNOSIS — G20 Parkinson's disease: Secondary | ICD-10-CM | POA: Diagnosis not present

## 2014-11-21 DIAGNOSIS — G20 Parkinson's disease: Secondary | ICD-10-CM | POA: Diagnosis not present

## 2014-11-22 DIAGNOSIS — G20 Parkinson's disease: Secondary | ICD-10-CM | POA: Diagnosis not present

## 2014-11-23 DIAGNOSIS — G20 Parkinson's disease: Secondary | ICD-10-CM | POA: Diagnosis not present

## 2014-11-24 DIAGNOSIS — G20 Parkinson's disease: Secondary | ICD-10-CM | POA: Diagnosis not present

## 2014-11-25 DIAGNOSIS — G20 Parkinson's disease: Secondary | ICD-10-CM | POA: Diagnosis not present

## 2014-11-26 DIAGNOSIS — G20 Parkinson's disease: Secondary | ICD-10-CM | POA: Diagnosis not present

## 2014-11-27 DIAGNOSIS — G20 Parkinson's disease: Secondary | ICD-10-CM | POA: Diagnosis not present

## 2014-11-28 DIAGNOSIS — G20 Parkinson's disease: Secondary | ICD-10-CM | POA: Diagnosis not present

## 2014-11-29 DIAGNOSIS — G20 Parkinson's disease: Secondary | ICD-10-CM | POA: Diagnosis not present

## 2014-11-30 DIAGNOSIS — G20 Parkinson's disease: Secondary | ICD-10-CM | POA: Diagnosis not present

## 2014-12-01 DIAGNOSIS — G20 Parkinson's disease: Secondary | ICD-10-CM | POA: Diagnosis not present

## 2014-12-02 DIAGNOSIS — G20 Parkinson's disease: Secondary | ICD-10-CM | POA: Diagnosis not present

## 2014-12-03 DIAGNOSIS — G20 Parkinson's disease: Secondary | ICD-10-CM | POA: Diagnosis not present

## 2014-12-04 DIAGNOSIS — G20 Parkinson's disease: Secondary | ICD-10-CM | POA: Diagnosis not present

## 2014-12-05 DIAGNOSIS — G20 Parkinson's disease: Secondary | ICD-10-CM | POA: Diagnosis not present

## 2014-12-06 DIAGNOSIS — G20 Parkinson's disease: Secondary | ICD-10-CM | POA: Diagnosis not present

## 2014-12-07 DIAGNOSIS — G20 Parkinson's disease: Secondary | ICD-10-CM | POA: Diagnosis not present

## 2014-12-08 DIAGNOSIS — G20 Parkinson's disease: Secondary | ICD-10-CM | POA: Diagnosis not present

## 2014-12-09 DIAGNOSIS — G20 Parkinson's disease: Secondary | ICD-10-CM | POA: Diagnosis not present

## 2014-12-10 ENCOUNTER — Telehealth: Payer: Self-pay | Admitting: Family

## 2014-12-10 DIAGNOSIS — G20 Parkinson's disease: Secondary | ICD-10-CM | POA: Diagnosis not present

## 2014-12-10 NOTE — Telephone Encounter (Signed)
Appointment given for tomorrow 3/4 Katherine Roth at 10:30am.

## 2014-12-11 ENCOUNTER — Encounter: Payer: Self-pay | Admitting: Physician Assistant

## 2014-12-11 ENCOUNTER — Ambulatory Visit (INDEPENDENT_AMBULATORY_CARE_PROVIDER_SITE_OTHER): Payer: Medicaid Other

## 2014-12-11 ENCOUNTER — Telehealth: Payer: Self-pay | Admitting: Family

## 2014-12-11 ENCOUNTER — Ambulatory Visit (INDEPENDENT_AMBULATORY_CARE_PROVIDER_SITE_OTHER): Payer: Medicaid Other | Admitting: Physician Assistant

## 2014-12-11 VITALS — BP 148/73 | HR 58 | Temp 97.1°F | Ht 65.0 in | Wt 146.4 lb

## 2014-12-11 DIAGNOSIS — G20 Parkinson's disease: Secondary | ICD-10-CM | POA: Diagnosis not present

## 2014-12-11 DIAGNOSIS — M25571 Pain in right ankle and joints of right foot: Secondary | ICD-10-CM

## 2014-12-11 NOTE — Progress Notes (Signed)
   Subjective:    Patient ID: Katherine Roth, female    DOB: 08/20/1933, 81 y.o.   MRN: [id number]  HPI 81 y/o with comorbid dementia, Parkinson's Disease presents with h/o R foot pain x 2-3 days. No known trauma. Poor historian. Accompanied by transporter at nursing facility   Review of Systems  Musculoskeletal: Positive for joint swelling (Trace of edema on medial ankle).  Skin: Positive for color change (trace of erythema, ecchymosis on medial plantar surface of right foot). Negative for wound.       Objective:   Physical Exam  Musculoskeletal: She exhibits edema (trace medial plantar surface right foot and ankle) and tenderness.  Xray negative for fracture, dislocation  Neurological:  Poor historian  Vitals reviewed.         Assessment & Plan:  1. Right foot sprain: Have patient wear ankle brace, fracture boot for discomfort. RICE, Mobic 7.5 mg q day until f/u in 2 weeks.

## 2014-12-12 DIAGNOSIS — G20 Parkinson's disease: Secondary | ICD-10-CM | POA: Diagnosis not present

## 2014-12-13 DIAGNOSIS — G20 Parkinson's disease: Secondary | ICD-10-CM | POA: Diagnosis not present

## 2014-12-14 DIAGNOSIS — G20 Parkinson's disease: Secondary | ICD-10-CM | POA: Diagnosis not present

## 2014-12-14 MED ORDER — MELOXICAM 7.5 MG PO TABS: 7.5000 mg | ORAL_TABLET | Freq: Every day | ORAL | Status: DC

## 2014-12-15 DIAGNOSIS — G20 Parkinson's disease: Secondary | ICD-10-CM | POA: Diagnosis not present

## 2014-12-16 DIAGNOSIS — G20 Parkinson's disease: Secondary | ICD-10-CM | POA: Diagnosis not present

## 2014-12-17 DIAGNOSIS — G20 Parkinson's disease: Secondary | ICD-10-CM | POA: Diagnosis not present

## 2014-12-18 DIAGNOSIS — G20 Parkinson's disease: Secondary | ICD-10-CM | POA: Diagnosis not present

## 2014-12-19 DIAGNOSIS — G20 Parkinson's disease: Secondary | ICD-10-CM | POA: Diagnosis not present

## 2014-12-20 DIAGNOSIS — G20 Parkinson's disease: Secondary | ICD-10-CM | POA: Diagnosis not present

## 2014-12-21 DIAGNOSIS — G20 Parkinson's disease: Secondary | ICD-10-CM | POA: Diagnosis not present

## 2014-12-22 DIAGNOSIS — G20 Parkinson's disease: Secondary | ICD-10-CM | POA: Diagnosis not present

## 2014-12-23 DIAGNOSIS — G20 Parkinson's disease: Secondary | ICD-10-CM | POA: Diagnosis not present

## 2014-12-24 DIAGNOSIS — G20 Parkinson's disease: Secondary | ICD-10-CM | POA: Diagnosis not present

## 2014-12-25 DIAGNOSIS — G20 Parkinson's disease: Secondary | ICD-10-CM | POA: Diagnosis not present

## 2014-12-25 NOTE — Telephone Encounter (Signed)
Detailed message left to call back if she still has any questions if not she may disregard this telephone message.

## 2014-12-26 DIAGNOSIS — G20 Parkinson's disease: Secondary | ICD-10-CM | POA: Diagnosis not present

## 2014-12-27 DIAGNOSIS — G20 Parkinson's disease: Secondary | ICD-10-CM | POA: Diagnosis not present

## 2014-12-28 DIAGNOSIS — G20 Parkinson's disease: Secondary | ICD-10-CM | POA: Diagnosis not present

## 2014-12-29 DIAGNOSIS — G20 Parkinson's disease: Secondary | ICD-10-CM | POA: Diagnosis not present

## 2014-12-30 DIAGNOSIS — G20 Parkinson's disease: Secondary | ICD-10-CM | POA: Diagnosis not present

## 2014-12-31 DIAGNOSIS — G20 Parkinson's disease: Secondary | ICD-10-CM | POA: Diagnosis not present

## 2015-01-01 DIAGNOSIS — G20 Parkinson's disease: Secondary | ICD-10-CM | POA: Diagnosis not present

## 2015-01-02 DIAGNOSIS — G20 Parkinson's disease: Secondary | ICD-10-CM | POA: Diagnosis not present

## 2015-01-03 DIAGNOSIS — G20 Parkinson's disease: Secondary | ICD-10-CM | POA: Diagnosis not present

## 2015-01-04 DIAGNOSIS — G20 Parkinson's disease: Secondary | ICD-10-CM | POA: Diagnosis not present

## 2015-01-05 DIAGNOSIS — G20 Parkinson's disease: Secondary | ICD-10-CM | POA: Diagnosis not present

## 2015-01-06 DIAGNOSIS — G20 Parkinson's disease: Secondary | ICD-10-CM | POA: Diagnosis not present

## 2015-01-07 DIAGNOSIS — G20 Parkinson's disease: Secondary | ICD-10-CM | POA: Diagnosis not present

## 2015-01-08 DIAGNOSIS — G20 Parkinson's disease: Secondary | ICD-10-CM | POA: Diagnosis not present

## 2015-01-09 DIAGNOSIS — G20 Parkinson's disease: Secondary | ICD-10-CM | POA: Diagnosis not present

## 2015-01-10 DIAGNOSIS — G20 Parkinson's disease: Secondary | ICD-10-CM | POA: Diagnosis not present

## 2015-01-11 DIAGNOSIS — G20 Parkinson's disease: Secondary | ICD-10-CM | POA: Diagnosis not present

## 2015-01-12 DIAGNOSIS — G20 Parkinson's disease: Secondary | ICD-10-CM | POA: Diagnosis not present

## 2015-01-13 DIAGNOSIS — G20 Parkinson's disease: Secondary | ICD-10-CM | POA: Diagnosis not present

## 2015-01-14 DIAGNOSIS — G20 Parkinson's disease: Secondary | ICD-10-CM | POA: Diagnosis not present

## 2015-01-15 DIAGNOSIS — G20 Parkinson's disease: Secondary | ICD-10-CM | POA: Diagnosis not present

## 2015-01-16 DIAGNOSIS — G20 Parkinson's disease: Secondary | ICD-10-CM | POA: Diagnosis not present

## 2015-01-17 DIAGNOSIS — G20 Parkinson's disease: Secondary | ICD-10-CM | POA: Diagnosis not present

## 2015-01-18 DIAGNOSIS — G20 Parkinson's disease: Secondary | ICD-10-CM | POA: Diagnosis not present

## 2015-01-19 DIAGNOSIS — G20 Parkinson's disease: Secondary | ICD-10-CM | POA: Diagnosis not present

## 2015-01-20 DIAGNOSIS — G20 Parkinson's disease: Secondary | ICD-10-CM | POA: Diagnosis not present

## 2015-01-21 DIAGNOSIS — G20 Parkinson's disease: Secondary | ICD-10-CM | POA: Diagnosis not present

## 2015-01-22 DIAGNOSIS — G20 Parkinson's disease: Secondary | ICD-10-CM | POA: Diagnosis not present

## 2015-01-23 DIAGNOSIS — G20 Parkinson's disease: Secondary | ICD-10-CM | POA: Diagnosis not present

## 2015-01-24 DIAGNOSIS — G20 Parkinson's disease: Secondary | ICD-10-CM | POA: Diagnosis not present

## 2015-01-25 DIAGNOSIS — G20 Parkinson's disease: Secondary | ICD-10-CM | POA: Diagnosis not present

## 2015-01-26 DIAGNOSIS — G20 Parkinson's disease: Secondary | ICD-10-CM | POA: Diagnosis not present

## 2015-01-27 DIAGNOSIS — G20 Parkinson's disease: Secondary | ICD-10-CM | POA: Diagnosis not present

## 2015-01-28 DIAGNOSIS — G20 Parkinson's disease: Secondary | ICD-10-CM | POA: Diagnosis not present

## 2015-01-29 DIAGNOSIS — G20 Parkinson's disease: Secondary | ICD-10-CM | POA: Diagnosis not present

## 2015-01-30 DIAGNOSIS — G20 Parkinson's disease: Secondary | ICD-10-CM | POA: Diagnosis not present

## 2015-01-31 DIAGNOSIS — G20 Parkinson's disease: Secondary | ICD-10-CM | POA: Diagnosis not present

## 2015-02-01 DIAGNOSIS — G20 Parkinson's disease: Secondary | ICD-10-CM | POA: Diagnosis not present

## 2015-02-02 DIAGNOSIS — G20 Parkinson's disease: Secondary | ICD-10-CM | POA: Diagnosis not present

## 2015-02-03 DIAGNOSIS — G20 Parkinson's disease: Secondary | ICD-10-CM | POA: Diagnosis not present

## 2015-02-04 DIAGNOSIS — G20 Parkinson's disease: Secondary | ICD-10-CM | POA: Diagnosis not present

## 2015-02-05 DIAGNOSIS — G20 Parkinson's disease: Secondary | ICD-10-CM | POA: Diagnosis not present

## 2015-02-06 DIAGNOSIS — G20 Parkinson's disease: Secondary | ICD-10-CM | POA: Diagnosis not present

## 2015-02-07 DIAGNOSIS — G20 Parkinson's disease: Secondary | ICD-10-CM | POA: Diagnosis not present

## 2015-02-08 DIAGNOSIS — G20 Parkinson's disease: Secondary | ICD-10-CM | POA: Diagnosis not present

## 2015-02-09 DIAGNOSIS — G20 Parkinson's disease: Secondary | ICD-10-CM | POA: Diagnosis not present

## 2015-02-10 DIAGNOSIS — G20 Parkinson's disease: Secondary | ICD-10-CM | POA: Diagnosis not present

## 2015-02-11 DIAGNOSIS — G20 Parkinson's disease: Secondary | ICD-10-CM | POA: Diagnosis not present

## 2015-02-12 DIAGNOSIS — G20 Parkinson's disease: Secondary | ICD-10-CM | POA: Diagnosis not present

## 2015-02-13 DIAGNOSIS — G20 Parkinson's disease: Secondary | ICD-10-CM | POA: Diagnosis not present

## 2015-02-14 DIAGNOSIS — G20 Parkinson's disease: Secondary | ICD-10-CM | POA: Diagnosis not present

## 2015-02-15 DIAGNOSIS — G20 Parkinson's disease: Secondary | ICD-10-CM | POA: Diagnosis not present

## 2015-02-16 DIAGNOSIS — G20 Parkinson's disease: Secondary | ICD-10-CM | POA: Diagnosis not present

## 2015-02-17 DIAGNOSIS — G20 Parkinson's disease: Secondary | ICD-10-CM | POA: Diagnosis not present

## 2015-02-18 DIAGNOSIS — G20 Parkinson's disease: Secondary | ICD-10-CM | POA: Diagnosis not present

## 2015-02-19 DIAGNOSIS — G20 Parkinson's disease: Secondary | ICD-10-CM | POA: Diagnosis not present

## 2015-02-20 DIAGNOSIS — G20 Parkinson's disease: Secondary | ICD-10-CM | POA: Diagnosis not present

## 2015-02-21 DIAGNOSIS — G20 Parkinson's disease: Secondary | ICD-10-CM | POA: Diagnosis not present

## 2015-02-22 DIAGNOSIS — G20 Parkinson's disease: Secondary | ICD-10-CM | POA: Diagnosis not present

## 2015-02-23 DIAGNOSIS — G20 Parkinson's disease: Secondary | ICD-10-CM | POA: Diagnosis not present

## 2015-02-24 DIAGNOSIS — G20 Parkinson's disease: Secondary | ICD-10-CM | POA: Diagnosis not present

## 2015-02-25 DIAGNOSIS — G20 Parkinson's disease: Secondary | ICD-10-CM | POA: Diagnosis not present

## 2015-02-26 DIAGNOSIS — G20 Parkinson's disease: Secondary | ICD-10-CM | POA: Diagnosis not present

## 2015-02-27 DIAGNOSIS — G20 Parkinson's disease: Secondary | ICD-10-CM | POA: Diagnosis not present

## 2015-02-28 DIAGNOSIS — G20 Parkinson's disease: Secondary | ICD-10-CM | POA: Diagnosis not present

## 2015-03-01 DIAGNOSIS — G20 Parkinson's disease: Secondary | ICD-10-CM | POA: Diagnosis not present

## 2015-03-02 DIAGNOSIS — G20 Parkinson's disease: Secondary | ICD-10-CM | POA: Diagnosis not present

## 2015-03-03 DIAGNOSIS — G20 Parkinson's disease: Secondary | ICD-10-CM | POA: Diagnosis not present

## 2015-03-04 DIAGNOSIS — G20 Parkinson's disease: Secondary | ICD-10-CM | POA: Diagnosis not present

## 2015-03-05 DIAGNOSIS — G20 Parkinson's disease: Secondary | ICD-10-CM | POA: Diagnosis not present

## 2015-03-06 DIAGNOSIS — G20 Parkinson's disease: Secondary | ICD-10-CM | POA: Diagnosis not present

## 2015-03-07 DIAGNOSIS — G20 Parkinson's disease: Secondary | ICD-10-CM | POA: Diagnosis not present

## 2015-03-08 DIAGNOSIS — G20 Parkinson's disease: Secondary | ICD-10-CM | POA: Diagnosis not present

## 2015-03-09 DIAGNOSIS — G20 Parkinson's disease: Secondary | ICD-10-CM | POA: Diagnosis not present

## 2015-03-10 DIAGNOSIS — G20 Parkinson's disease: Secondary | ICD-10-CM | POA: Diagnosis not present

## 2015-03-11 DIAGNOSIS — G20 Parkinson's disease: Secondary | ICD-10-CM | POA: Diagnosis not present

## 2015-03-12 DIAGNOSIS — G20 Parkinson's disease: Secondary | ICD-10-CM | POA: Diagnosis not present

## 2015-03-13 DIAGNOSIS — G20 Parkinson's disease: Secondary | ICD-10-CM | POA: Diagnosis not present

## 2015-03-14 DIAGNOSIS — G20 Parkinson's disease: Secondary | ICD-10-CM | POA: Diagnosis not present

## 2015-03-15 DIAGNOSIS — G20 Parkinson's disease: Secondary | ICD-10-CM | POA: Diagnosis not present

## 2015-03-16 DIAGNOSIS — G20 Parkinson's disease: Secondary | ICD-10-CM | POA: Diagnosis not present

## 2015-03-17 DIAGNOSIS — G20 Parkinson's disease: Secondary | ICD-10-CM | POA: Diagnosis not present

## 2015-03-18 DIAGNOSIS — G20 Parkinson's disease: Secondary | ICD-10-CM | POA: Diagnosis not present

## 2015-03-19 DIAGNOSIS — G20 Parkinson's disease: Secondary | ICD-10-CM | POA: Diagnosis not present

## 2015-03-20 DIAGNOSIS — G20 Parkinson's disease: Secondary | ICD-10-CM | POA: Diagnosis not present

## 2015-03-21 DIAGNOSIS — G20 Parkinson's disease: Secondary | ICD-10-CM | POA: Diagnosis not present

## 2015-03-22 DIAGNOSIS — G20 Parkinson's disease: Secondary | ICD-10-CM | POA: Diagnosis not present

## 2015-03-23 DIAGNOSIS — G20 Parkinson's disease: Secondary | ICD-10-CM | POA: Diagnosis not present

## 2015-03-24 DIAGNOSIS — G20 Parkinson's disease: Secondary | ICD-10-CM | POA: Diagnosis not present

## 2015-03-25 DIAGNOSIS — G20 Parkinson's disease: Secondary | ICD-10-CM | POA: Diagnosis not present

## 2015-03-26 DIAGNOSIS — G20 Parkinson's disease: Secondary | ICD-10-CM | POA: Diagnosis not present

## 2015-03-27 DIAGNOSIS — G20 Parkinson's disease: Secondary | ICD-10-CM | POA: Diagnosis not present

## 2015-03-28 DIAGNOSIS — G20 Parkinson's disease: Secondary | ICD-10-CM | POA: Diagnosis not present

## 2015-03-29 DIAGNOSIS — G20 Parkinson's disease: Secondary | ICD-10-CM | POA: Diagnosis not present

## 2015-03-30 DIAGNOSIS — G20 Parkinson's disease: Secondary | ICD-10-CM | POA: Diagnosis not present

## 2015-03-31 DIAGNOSIS — G20 Parkinson's disease: Secondary | ICD-10-CM | POA: Diagnosis not present

## 2015-04-01 DIAGNOSIS — G20 Parkinson's disease: Secondary | ICD-10-CM | POA: Diagnosis not present

## 2015-04-02 DIAGNOSIS — G20 Parkinson's disease: Secondary | ICD-10-CM | POA: Diagnosis not present

## 2015-04-03 DIAGNOSIS — G20 Parkinson's disease: Secondary | ICD-10-CM | POA: Diagnosis not present

## 2015-04-04 DIAGNOSIS — G20 Parkinson's disease: Secondary | ICD-10-CM | POA: Diagnosis not present

## 2015-04-05 DIAGNOSIS — G20 Parkinson's disease: Secondary | ICD-10-CM | POA: Diagnosis not present

## 2015-04-06 DIAGNOSIS — G20 Parkinson's disease: Secondary | ICD-10-CM | POA: Diagnosis not present

## 2015-04-07 DIAGNOSIS — G20 Parkinson's disease: Secondary | ICD-10-CM | POA: Diagnosis not present

## 2015-04-08 DIAGNOSIS — G20 Parkinson's disease: Secondary | ICD-10-CM | POA: Diagnosis not present

## 2015-04-09 DIAGNOSIS — G20 Parkinson's disease: Secondary | ICD-10-CM | POA: Diagnosis not present

## 2015-04-10 DIAGNOSIS — G20 Parkinson's disease: Secondary | ICD-10-CM | POA: Diagnosis not present

## 2015-04-11 DIAGNOSIS — G20 Parkinson's disease: Secondary | ICD-10-CM | POA: Diagnosis not present

## 2015-04-12 DIAGNOSIS — G20 Parkinson's disease: Secondary | ICD-10-CM | POA: Diagnosis not present

## 2015-04-13 DIAGNOSIS — G20 Parkinson's disease: Secondary | ICD-10-CM | POA: Diagnosis not present

## 2015-04-14 DIAGNOSIS — G20 Parkinson's disease: Secondary | ICD-10-CM | POA: Diagnosis not present

## 2015-04-15 DIAGNOSIS — G20 Parkinson's disease: Secondary | ICD-10-CM | POA: Diagnosis not present

## 2015-04-16 DIAGNOSIS — G20 Parkinson's disease: Secondary | ICD-10-CM | POA: Diagnosis not present

## 2015-04-17 DIAGNOSIS — G20 Parkinson's disease: Secondary | ICD-10-CM | POA: Diagnosis not present

## 2015-04-18 DIAGNOSIS — G20 Parkinson's disease: Secondary | ICD-10-CM | POA: Diagnosis not present

## 2015-04-19 DIAGNOSIS — G20 Parkinson's disease: Secondary | ICD-10-CM | POA: Diagnosis not present

## 2015-04-20 DIAGNOSIS — G20 Parkinson's disease: Secondary | ICD-10-CM | POA: Diagnosis not present

## 2015-04-21 DIAGNOSIS — G20 Parkinson's disease: Secondary | ICD-10-CM | POA: Diagnosis not present

## 2015-04-22 DIAGNOSIS — G20 Parkinson's disease: Secondary | ICD-10-CM | POA: Diagnosis not present

## 2015-04-23 DIAGNOSIS — G20 Parkinson's disease: Secondary | ICD-10-CM | POA: Diagnosis not present

## 2015-04-24 DIAGNOSIS — G20 Parkinson's disease: Secondary | ICD-10-CM | POA: Diagnosis not present

## 2015-04-25 DIAGNOSIS — G20 Parkinson's disease: Secondary | ICD-10-CM | POA: Diagnosis not present

## 2015-04-26 DIAGNOSIS — G20 Parkinson's disease: Secondary | ICD-10-CM | POA: Diagnosis not present

## 2015-04-27 DIAGNOSIS — G20 Parkinson's disease: Secondary | ICD-10-CM | POA: Diagnosis not present

## 2015-04-28 DIAGNOSIS — G20 Parkinson's disease: Secondary | ICD-10-CM | POA: Diagnosis not present

## 2015-04-29 DIAGNOSIS — G20 Parkinson's disease: Secondary | ICD-10-CM | POA: Diagnosis not present

## 2015-04-30 DIAGNOSIS — G20 Parkinson's disease: Secondary | ICD-10-CM | POA: Diagnosis not present

## 2015-05-01 DIAGNOSIS — G20 Parkinson's disease: Secondary | ICD-10-CM | POA: Diagnosis not present

## 2015-05-02 DIAGNOSIS — G20 Parkinson's disease: Secondary | ICD-10-CM | POA: Diagnosis not present

## 2015-05-03 DIAGNOSIS — G20 Parkinson's disease: Secondary | ICD-10-CM | POA: Diagnosis not present

## 2015-05-04 DIAGNOSIS — G20 Parkinson's disease: Secondary | ICD-10-CM | POA: Diagnosis not present

## 2015-05-05 DIAGNOSIS — G20 Parkinson's disease: Secondary | ICD-10-CM | POA: Diagnosis not present

## 2015-05-06 DIAGNOSIS — G20 Parkinson's disease: Secondary | ICD-10-CM | POA: Diagnosis not present

## 2015-05-07 DIAGNOSIS — G20 Parkinson's disease: Secondary | ICD-10-CM | POA: Diagnosis not present

## 2015-05-08 DIAGNOSIS — G20 Parkinson's disease: Secondary | ICD-10-CM | POA: Diagnosis not present

## 2015-05-09 DIAGNOSIS — G20 Parkinson's disease: Secondary | ICD-10-CM | POA: Diagnosis not present

## 2015-05-10 DIAGNOSIS — G20 Parkinson's disease: Secondary | ICD-10-CM | POA: Diagnosis not present

## 2015-05-11 DIAGNOSIS — G20 Parkinson's disease: Secondary | ICD-10-CM | POA: Diagnosis not present

## 2015-05-12 DIAGNOSIS — G20 Parkinson's disease: Secondary | ICD-10-CM | POA: Diagnosis not present

## 2015-05-13 DIAGNOSIS — G20 Parkinson's disease: Secondary | ICD-10-CM | POA: Diagnosis not present

## 2015-05-14 DIAGNOSIS — G20 Parkinson's disease: Secondary | ICD-10-CM | POA: Diagnosis not present

## 2015-05-15 DIAGNOSIS — G20 Parkinson's disease: Secondary | ICD-10-CM | POA: Diagnosis not present

## 2015-05-16 DIAGNOSIS — G20 Parkinson's disease: Secondary | ICD-10-CM | POA: Diagnosis not present

## 2015-05-17 DIAGNOSIS — G20 Parkinson's disease: Secondary | ICD-10-CM | POA: Diagnosis not present

## 2015-05-18 ENCOUNTER — Other Ambulatory Visit: Payer: Medicaid Other

## 2015-05-18 DIAGNOSIS — N390 Urinary tract infection, site not specified: Secondary | ICD-10-CM | POA: Diagnosis not present

## 2015-05-18 DIAGNOSIS — G20 Parkinson's disease: Secondary | ICD-10-CM | POA: Diagnosis not present

## 2015-05-19 DIAGNOSIS — G20 Parkinson's disease: Secondary | ICD-10-CM | POA: Diagnosis not present

## 2015-05-20 ENCOUNTER — Other Ambulatory Visit: Payer: Self-pay | Admitting: Nurse Practitioner

## 2015-05-20 ENCOUNTER — Telehealth: Payer: Self-pay | Admitting: Family Medicine

## 2015-05-20 DIAGNOSIS — G20 Parkinson's disease: Secondary | ICD-10-CM | POA: Diagnosis not present

## 2015-05-20 MED ORDER — CIPROFLOXACIN HCL 500 MG PO TABS: 500.0000 mg | ORAL_TABLET | Freq: Two times a day (BID) | ORAL | Status: DC

## 2015-05-20 NOTE — Telephone Encounter (Signed)
Detailed message left for daughter that we have sent in abx to [facility] per Ronnald Collum, FNP.

## 2015-05-21 DIAGNOSIS — G20 Parkinson's disease: Secondary | ICD-10-CM | POA: Diagnosis not present

## 2015-05-22 DIAGNOSIS — G20 Parkinson's disease: Secondary | ICD-10-CM | POA: Diagnosis not present

## 2015-05-23 DIAGNOSIS — G20 Parkinson's disease: Secondary | ICD-10-CM | POA: Diagnosis not present

## 2015-05-24 DIAGNOSIS — G20 Parkinson's disease: Secondary | ICD-10-CM | POA: Diagnosis not present

## 2015-05-25 DIAGNOSIS — G20 Parkinson's disease: Secondary | ICD-10-CM | POA: Diagnosis not present

## 2015-05-26 DIAGNOSIS — G20 Parkinson's disease: Secondary | ICD-10-CM | POA: Diagnosis not present

## 2015-05-27 DIAGNOSIS — G20 Parkinson's disease: Secondary | ICD-10-CM | POA: Diagnosis not present

## 2015-05-28 DIAGNOSIS — G20 Parkinson's disease: Secondary | ICD-10-CM | POA: Diagnosis not present

## 2015-05-29 DIAGNOSIS — G20 Parkinson's disease: Secondary | ICD-10-CM | POA: Diagnosis not present

## 2015-05-30 DIAGNOSIS — G20 Parkinson's disease: Secondary | ICD-10-CM | POA: Diagnosis not present

## 2015-05-31 DIAGNOSIS — G20 Parkinson's disease: Secondary | ICD-10-CM | POA: Diagnosis not present

## 2015-06-01 DIAGNOSIS — G20 Parkinson's disease: Secondary | ICD-10-CM | POA: Diagnosis not present

## 2015-06-02 DIAGNOSIS — G20 Parkinson's disease: Secondary | ICD-10-CM | POA: Diagnosis not present

## 2015-06-03 DIAGNOSIS — G20 Parkinson's disease: Secondary | ICD-10-CM | POA: Diagnosis not present

## 2015-06-04 DIAGNOSIS — G20 Parkinson's disease: Secondary | ICD-10-CM | POA: Diagnosis not present

## 2015-06-05 DIAGNOSIS — G20 Parkinson's disease: Secondary | ICD-10-CM | POA: Diagnosis not present

## 2015-06-06 DIAGNOSIS — G20 Parkinson's disease: Secondary | ICD-10-CM | POA: Diagnosis not present

## 2015-06-07 DIAGNOSIS — G20 Parkinson's disease: Secondary | ICD-10-CM | POA: Diagnosis not present

## 2015-06-08 DIAGNOSIS — G20 Parkinson's disease: Secondary | ICD-10-CM | POA: Diagnosis not present

## 2015-06-09 DIAGNOSIS — G20 Parkinson's disease: Secondary | ICD-10-CM | POA: Diagnosis not present

## 2015-06-10 DIAGNOSIS — G20 Parkinson's disease: Secondary | ICD-10-CM | POA: Diagnosis not present

## 2015-06-11 DIAGNOSIS — G20 Parkinson's disease: Secondary | ICD-10-CM | POA: Diagnosis not present

## 2015-06-12 DIAGNOSIS — G20 Parkinson's disease: Secondary | ICD-10-CM | POA: Diagnosis not present

## 2015-06-13 DIAGNOSIS — G20 Parkinson's disease: Secondary | ICD-10-CM | POA: Diagnosis not present

## 2015-06-14 DIAGNOSIS — G20 Parkinson's disease: Secondary | ICD-10-CM | POA: Diagnosis not present

## 2015-06-15 DIAGNOSIS — G20 Parkinson's disease: Secondary | ICD-10-CM | POA: Diagnosis not present

## 2015-06-16 DIAGNOSIS — G20 Parkinson's disease: Secondary | ICD-10-CM | POA: Diagnosis not present

## 2015-06-17 ENCOUNTER — Ambulatory Visit: Payer: Self-pay | Admitting: Family Medicine

## 2015-06-17 DIAGNOSIS — G20 Parkinson's disease: Secondary | ICD-10-CM | POA: Diagnosis not present

## 2015-06-18 DIAGNOSIS — G20 Parkinson's disease: Secondary | ICD-10-CM | POA: Diagnosis not present

## 2015-06-19 DIAGNOSIS — G20 Parkinson's disease: Secondary | ICD-10-CM | POA: Diagnosis not present

## 2015-06-20 DIAGNOSIS — G20 Parkinson's disease: Secondary | ICD-10-CM | POA: Diagnosis not present

## 2015-06-21 DIAGNOSIS — G20 Parkinson's disease: Secondary | ICD-10-CM | POA: Diagnosis not present

## 2015-06-22 DIAGNOSIS — G20 Parkinson's disease: Secondary | ICD-10-CM | POA: Diagnosis not present

## 2015-06-23 DIAGNOSIS — G20 Parkinson's disease: Secondary | ICD-10-CM | POA: Diagnosis not present

## 2015-06-24 DIAGNOSIS — G20 Parkinson's disease: Secondary | ICD-10-CM | POA: Diagnosis not present

## 2015-06-25 DIAGNOSIS — G20 Parkinson's disease: Secondary | ICD-10-CM | POA: Diagnosis not present

## 2015-06-26 DIAGNOSIS — G20 Parkinson's disease: Secondary | ICD-10-CM | POA: Diagnosis not present

## 2015-06-27 DIAGNOSIS — G20 Parkinson's disease: Secondary | ICD-10-CM | POA: Diagnosis not present

## 2015-06-28 DIAGNOSIS — G20 Parkinson's disease: Secondary | ICD-10-CM | POA: Diagnosis not present

## 2015-06-29 DIAGNOSIS — G20 Parkinson's disease: Secondary | ICD-10-CM | POA: Diagnosis not present

## 2015-06-30 DIAGNOSIS — G20 Parkinson's disease: Secondary | ICD-10-CM | POA: Diagnosis not present

## 2015-07-01 DIAGNOSIS — G20 Parkinson's disease: Secondary | ICD-10-CM | POA: Diagnosis not present

## 2015-07-02 DIAGNOSIS — G20 Parkinson's disease: Secondary | ICD-10-CM | POA: Diagnosis not present

## 2015-07-03 DIAGNOSIS — G20 Parkinson's disease: Secondary | ICD-10-CM | POA: Diagnosis not present

## 2015-07-04 DIAGNOSIS — G20 Parkinson's disease: Secondary | ICD-10-CM | POA: Diagnosis not present

## 2015-07-05 DIAGNOSIS — G20 Parkinson's disease: Secondary | ICD-10-CM | POA: Diagnosis not present

## 2015-07-06 DIAGNOSIS — G20 Parkinson's disease: Secondary | ICD-10-CM | POA: Diagnosis not present

## 2015-07-07 DIAGNOSIS — G20 Parkinson's disease: Secondary | ICD-10-CM | POA: Diagnosis not present

## 2015-07-08 DIAGNOSIS — G20 Parkinson's disease: Secondary | ICD-10-CM | POA: Diagnosis not present

## 2015-07-09 DIAGNOSIS — G20 Parkinson's disease: Secondary | ICD-10-CM | POA: Diagnosis not present

## 2015-07-10 DIAGNOSIS — G20 Parkinson's disease: Secondary | ICD-10-CM | POA: Diagnosis not present

## 2015-07-11 DIAGNOSIS — G20 Parkinson's disease: Secondary | ICD-10-CM | POA: Diagnosis not present

## 2015-07-12 DIAGNOSIS — G20 Parkinson's disease: Secondary | ICD-10-CM | POA: Diagnosis not present

## 2015-07-13 DIAGNOSIS — G20 Parkinson's disease: Secondary | ICD-10-CM | POA: Diagnosis not present

## 2015-07-14 DIAGNOSIS — G20 Parkinson's disease: Secondary | ICD-10-CM | POA: Diagnosis not present

## 2015-07-15 DIAGNOSIS — G20 Parkinson's disease: Secondary | ICD-10-CM | POA: Diagnosis not present

## 2015-07-16 DIAGNOSIS — G20 Parkinson's disease: Secondary | ICD-10-CM | POA: Diagnosis not present

## 2015-07-17 DIAGNOSIS — G20 Parkinson's disease: Secondary | ICD-10-CM | POA: Diagnosis not present

## 2015-07-18 DIAGNOSIS — G20 Parkinson's disease: Secondary | ICD-10-CM | POA: Diagnosis not present

## 2015-07-19 DIAGNOSIS — G20 Parkinson's disease: Secondary | ICD-10-CM | POA: Diagnosis not present

## 2015-07-20 DIAGNOSIS — G20 Parkinson's disease: Secondary | ICD-10-CM | POA: Diagnosis not present

## 2015-07-21 DIAGNOSIS — G20 Parkinson's disease: Secondary | ICD-10-CM | POA: Diagnosis not present

## 2015-07-22 DIAGNOSIS — G20 Parkinson's disease: Secondary | ICD-10-CM | POA: Diagnosis not present

## 2015-07-23 DIAGNOSIS — G20 Parkinson's disease: Secondary | ICD-10-CM | POA: Diagnosis not present

## 2015-07-24 DIAGNOSIS — G20 Parkinson's disease: Secondary | ICD-10-CM | POA: Diagnosis not present

## 2015-07-25 DIAGNOSIS — G20 Parkinson's disease: Secondary | ICD-10-CM | POA: Diagnosis not present

## 2015-07-26 ENCOUNTER — Telehealth: Payer: Self-pay | Admitting: Family

## 2015-07-26 DIAGNOSIS — G20 Parkinson's disease: Secondary | ICD-10-CM | POA: Diagnosis not present

## 2015-07-27 DIAGNOSIS — G20 Parkinson's disease: Secondary | ICD-10-CM | POA: Diagnosis not present

## 2015-07-28 DIAGNOSIS — G20 Parkinson's disease: Secondary | ICD-10-CM | POA: Diagnosis not present

## 2015-07-29 DIAGNOSIS — G20 Parkinson's disease: Secondary | ICD-10-CM | POA: Diagnosis not present

## 2015-07-30 DIAGNOSIS — G20 Parkinson's disease: Secondary | ICD-10-CM | POA: Diagnosis not present

## 2015-07-31 DIAGNOSIS — G20 Parkinson's disease: Secondary | ICD-10-CM | POA: Diagnosis not present

## 2015-08-01 DIAGNOSIS — G20 Parkinson's disease: Secondary | ICD-10-CM | POA: Diagnosis not present

## 2015-08-02 DIAGNOSIS — G20 Parkinson's disease: Secondary | ICD-10-CM | POA: Diagnosis not present

## 2015-08-03 DIAGNOSIS — G20 Parkinson's disease: Secondary | ICD-10-CM | POA: Diagnosis not present

## 2015-08-04 DIAGNOSIS — G20 Parkinson's disease: Secondary | ICD-10-CM | POA: Diagnosis not present

## 2015-08-05 DIAGNOSIS — G20 Parkinson's disease: Secondary | ICD-10-CM | POA: Diagnosis not present

## 2015-08-06 DIAGNOSIS — G20 Parkinson's disease: Secondary | ICD-10-CM | POA: Diagnosis not present

## 2015-08-07 DIAGNOSIS — G20 Parkinson's disease: Secondary | ICD-10-CM | POA: Diagnosis not present

## 2015-08-08 DIAGNOSIS — G20 Parkinson's disease: Secondary | ICD-10-CM | POA: Diagnosis not present

## 2015-08-09 DIAGNOSIS — G20 Parkinson's disease: Secondary | ICD-10-CM | POA: Diagnosis not present

## 2015-08-10 DIAGNOSIS — G20 Parkinson's disease: Secondary | ICD-10-CM | POA: Diagnosis not present

## 2015-08-11 DIAGNOSIS — G20 Parkinson's disease: Secondary | ICD-10-CM | POA: Diagnosis not present

## 2015-08-12 DIAGNOSIS — G20 Parkinson's disease: Secondary | ICD-10-CM | POA: Diagnosis not present

## 2015-08-13 DIAGNOSIS — G20 Parkinson's disease: Secondary | ICD-10-CM | POA: Diagnosis not present

## 2015-08-14 DIAGNOSIS — G20 Parkinson's disease: Secondary | ICD-10-CM | POA: Diagnosis not present

## 2015-08-15 DIAGNOSIS — G20 Parkinson's disease: Secondary | ICD-10-CM | POA: Diagnosis not present

## 2015-08-16 DIAGNOSIS — G20 Parkinson's disease: Secondary | ICD-10-CM | POA: Diagnosis not present

## 2015-08-17 DIAGNOSIS — G20 Parkinson's disease: Secondary | ICD-10-CM | POA: Diagnosis not present

## 2015-08-18 DIAGNOSIS — G20 Parkinson's disease: Secondary | ICD-10-CM | POA: Diagnosis not present

## 2015-08-19 DIAGNOSIS — G20 Parkinson's disease: Secondary | ICD-10-CM | POA: Diagnosis not present

## 2015-08-20 DIAGNOSIS — G20 Parkinson's disease: Secondary | ICD-10-CM | POA: Diagnosis not present

## 2015-08-21 DIAGNOSIS — G20 Parkinson's disease: Secondary | ICD-10-CM | POA: Diagnosis not present

## 2015-08-22 DIAGNOSIS — G20 Parkinson's disease: Secondary | ICD-10-CM | POA: Diagnosis not present

## 2015-08-23 DIAGNOSIS — G20 Parkinson's disease: Secondary | ICD-10-CM | POA: Diagnosis not present

## 2015-08-24 DIAGNOSIS — G20 Parkinson's disease: Secondary | ICD-10-CM | POA: Diagnosis not present

## 2015-08-25 DIAGNOSIS — G20 Parkinson's disease: Secondary | ICD-10-CM | POA: Diagnosis not present

## 2015-08-26 DIAGNOSIS — G20 Parkinson's disease: Secondary | ICD-10-CM | POA: Diagnosis not present

## 2015-08-27 DIAGNOSIS — G20 Parkinson's disease: Secondary | ICD-10-CM | POA: Diagnosis not present

## 2015-08-28 DIAGNOSIS — G20 Parkinson's disease: Secondary | ICD-10-CM | POA: Diagnosis not present

## 2015-08-29 DIAGNOSIS — G20 Parkinson's disease: Secondary | ICD-10-CM | POA: Diagnosis not present

## 2015-09-09 ENCOUNTER — Encounter: Payer: Self-pay | Admitting: Family Medicine

## 2015-09-09 ENCOUNTER — Ambulatory Visit (INDEPENDENT_AMBULATORY_CARE_PROVIDER_SITE_OTHER): Payer: Medicare Other | Admitting: Family Medicine

## 2015-09-09 VITALS — BP 166/70 | HR 52 | Temp 97.3°F | Ht 65.0 in | Wt 151.8 lb

## 2015-09-09 DIAGNOSIS — E034 Atrophy of thyroid (acquired): Secondary | ICD-10-CM

## 2015-09-09 DIAGNOSIS — E038 Other specified hypothyroidism: Secondary | ICD-10-CM

## 2015-09-09 DIAGNOSIS — I10 Essential (primary) hypertension: Secondary | ICD-10-CM | POA: Diagnosis not present

## 2015-09-09 DIAGNOSIS — E785 Hyperlipidemia, unspecified: Secondary | ICD-10-CM

## 2015-09-09 NOTE — Progress Notes (Signed)
BP 166/70 mmHg  Pulse 52  Temp(Src) 97.3 F (36.3 C) (Oral)  Ht $R'5\' 5"'JV$  (1.651 m)  Wt 151 lb 12.8 oz (68.856 kg)  BMI 25.26 kg/m2   Subjective:    Patient ID: Katherine Roth, female    DOB: 1933/07/06, 81 y.o.   MRN: [id number]  HPI: Katherine Roth is a 81 y.o. female presenting on 09/09/2015 for Hypertension and Altered Mental Status   HPI Hypertension  Due to the patient's severe dementia and most of the history and review of systems are taken from her caretakers who are here with her today. Patient is currently on Tenoretic and losartan for her blood pressure. Her blood pressure is slightly elevated today at 166/70. She is at a nursing home facility for severe dementia. We'll have them call me in a couple weeks with blood pressure results to see what she is running there and then we'll evaluate for BP. They deny knowledge of her having any chest pain, headaches, vision troubles.  Hypothyroidism Patient has the diagnosis of hypothyroidism and is currently on Synthroid 125 g, and has been a while since she's been checked. They deny any symptoms of thyroid issues that they know of.  Hyperlipidemia Patient is currently on Pravachol 40 mg and is been a while since she's had it rechecked.  Relevant past medical, surgical, family and social history reviewed and updated as indicated. Interim medical history since our last visit reviewed. Allergies and medications reviewed and updated.  Review of Systems  Constitutional: Negative for fever and chills.  HENT: Negative for congestion, ear discharge and ear pain.   Eyes: Negative for redness and visual disturbance.  Respiratory: Negative for chest tightness and shortness of breath.   Cardiovascular: Negative for chest pain, palpitations and leg swelling.  Genitourinary: Negative for dysuria and difficulty urinating.  Musculoskeletal: Negative for back pain and gait problem.  Skin: Negative for rash.  Neurological: Negative for  dizziness, light-headedness and headaches.  Psychiatric/Behavioral: Negative for behavioral problems and agitation.  All other systems reviewed and are negative.   Per HPI unless specifically indicated above     Medication List       This list is accurate as of: 09/09/15  5:04 PM.  Always use your most recent med list.               aspirin 81 MG tablet  Take 81 mg by mouth daily.     atenolol-chlorthalidone 100-25 MG tablet  Commonly known as:  TENORETIC  Take 1 tablet by mouth daily.     carbidopa-levodopa 25-100 MG tablet  Commonly known as:  SINEMET IR  TAKE 1 TABLET BY MOUTH 3 (THREE) TIMES DAILY.     donepezil 5 MG tablet  Commonly known as:  ARICEPT  Take 5 mg by mouth at bedtime as needed.     FLUoxetine 20 MG capsule  Commonly known as:  PROZAC  TAKE 1 CAPSULE EVERY DAY     levothyroxine 125 MCG tablet  Commonly known as:  SYNTHROID, LEVOTHROID  TAKE 1 TABLET BY MOUTH EVERY DAY     losartan 50 MG tablet  Commonly known as:  COZAAR  TAKE 1 TABLET BY MOUTH EVERY DAY     pravastatin 40 MG tablet  Commonly known as:  PRAVACHOL  Take 40 mg by mouth daily.     QUEtiapine 25 MG tablet  Commonly known as:  SEROQUEL  TAKE 1 TABLET (25 MG TOTAL) BY MOUTH AT BEDTIME.  risperiDONE 0.25 MG tablet  Commonly known as:  RISPERDAL  TAKE 1 TABLET (0.25 MG TOTAL) BY MOUTH AT BEDTIME.           Objective:    BP 166/70 mmHg  Pulse 52  Temp(Src) 97.3 F (36.3 C) (Oral)  Ht $R'5\' 5"'ei$  (1.651 m)  Wt 151 lb 12.8 oz (68.856 kg)  BMI 25.26 kg/m2  Wt Readings from Last 3 Encounters:  09/09/15 151 lb 12.8 oz (68.856 kg)  12/11/14 146 lb 6.4 oz (66.407 kg)  10/19/14 149 lb (67.586 kg)    Physical Exam  Constitutional: She appears well-developed and well-nourished. No distress.  Eyes: Conjunctivae and EOM are normal. Pupils are equal, round, and reactive to light.  Cardiovascular: Normal rate, regular rhythm, normal heart sounds and intact distal pulses.   No  murmur heard. Pulmonary/Chest: Effort normal and breath sounds normal. No respiratory distress. She has no wheezes.  Musculoskeletal: Normal range of motion. She exhibits no edema or tenderness.  Neurological: She is alert. She is disoriented (because of dementia is oriented to person only). Coordination normal.  Skin: Skin is warm and dry. No rash noted. She is not diaphoretic.  Psychiatric: She has a normal mood and affect. Her speech is normal and behavior is normal. Cognition and memory are impaired. She exhibits abnormal recent memory and abnormal remote memory.  Nursing note and vitals reviewed.   Results for orders placed or performed in visit on 10/19/14  POCT urinalysis dipstick  Result Value Ref Range   Color, UA yellow    Clarity, UA clear    Glucose, UA neg    Bilirubin, UA neg    Ketones, UA neg    Spec Grav, UA 1.020    Blood, UA trace    pH, UA 6.0    Protein, UA neg    Urobilinogen, UA negative    Nitrite, UA pos    Leukocytes, UA Negative   POCT UA - Microscopic Only  Result Value Ref Range   WBC, Ur, HPF, POC 5-8    RBC, urine, microscopic occ    Bacteria, U Microscopic many    Mucus, UA neg    Epithelial cells, urine per micros few    Crystals, Ur, HPF, POC neg    Casts, Ur, LPF, POC neg    Yeast, UA neg       Assessment & Plan:   Problem List Items Addressed This Visit      Cardiovascular and Mediastinum   Essential (primary) hypertension    Abdomen check over the next 2 weeks for blood pressure at the facility that she is and call with the numbers. Also call with heart rate at same time.      Relevant Orders   CMP14+EGFR (Completed)     Endocrine   Hypothyroidism   Relevant Orders   TSH (Completed)     Other   Hyperlipidemia - Primary   Relevant Orders   Lipid panel (Completed)       Follow up plan: Return in about 6 months (around 03/09/2016), or if symptoms worsen or fail to improve, for Htn, HLD, Thyroid.  Counseling provided for  all of the vaccine components Orders Placed This Encounter  Procedures  . CMP14+EGFR  . Lipid panel  . TSH    Caryl Pina, MD [facility] 09/09/2015, 5:04 PM

## 2015-09-10 LAB — CMP14+EGFR
A/G RATIO: 1.1 (ref 1.1–2.5)
ALBUMIN: 3.6 g/dL (ref 3.5–4.7)
ALT: 15 IU/L (ref 0–32)
AST: 35 IU/L (ref 0–40)
Alkaline Phosphatase: 89 IU/L (ref 39–117)
BILIRUBIN TOTAL: 0.4 mg/dL (ref 0.0–1.2)
BUN/Creatinine Ratio: 14 (ref 11–26)
BUN: 14 mg/dL (ref 8–27)
CO2: 29 mmol/L (ref 18–29)
Calcium: 9.3 mg/dL (ref 8.7–10.3)
Chloride: 96 mmol/L — ABNORMAL LOW (ref 97–106)
Creatinine, Ser: 1.02 mg/dL — ABNORMAL HIGH (ref 0.57–1.00)
GFR calc non Af Amer: 52 mL/min/{1.73_m2} — ABNORMAL LOW (ref >59)
GFR, EST AFRICAN AMERICAN: 60 mL/min/{1.73_m2} (ref >59)
GLOBULIN, TOTAL: 3.2 g/dL (ref 1.5–4.5)
Glucose: 122 mg/dL — ABNORMAL HIGH (ref 65–99)
POTASSIUM: 3.3 mmol/L — AB (ref 3.5–5.2)
SODIUM: 141 mmol/L (ref 136–144)
TOTAL PROTEIN: 6.8 g/dL (ref 6.0–8.5)

## 2015-09-10 LAB — LIPID PANEL
Chol/HDL Ratio: 4 ratio units (ref 0.0–4.4)
Cholesterol, Total: 128 mg/dL (ref 100–199)
HDL: 32 mg/dL — ABNORMAL LOW (ref >39)
LDL Calculated: 33 mg/dL (ref 0–99)
Triglycerides: 313 mg/dL — ABNORMAL HIGH (ref 0–149)
VLDL CHOLESTEROL CAL: 63 mg/dL — AB (ref 5–40)

## 2015-09-10 LAB — TSH: TSH: 0.039 u[IU]/mL — ABNORMAL LOW (ref 0.450–4.500)

## 2015-09-13 NOTE — Assessment & Plan Note (Signed)
Abdomen check over the next 2 weeks for blood pressure at the facility that she is and call with the numbers. Also call with heart rate at same time.

## 2016-01-20 ENCOUNTER — Ambulatory Visit (INDEPENDENT_AMBULATORY_CARE_PROVIDER_SITE_OTHER): Payer: Medicare Other | Admitting: Family

## 2016-01-20 ENCOUNTER — Encounter: Payer: Self-pay | Admitting: Family

## 2016-01-20 VITALS — BP 148/75 | HR 58 | Temp 97.9°F | Ht 65.0 in | Wt 146.0 lb

## 2016-01-20 DIAGNOSIS — E038 Other specified hypothyroidism: Secondary | ICD-10-CM | POA: Diagnosis not present

## 2016-01-20 DIAGNOSIS — M1990 Unspecified osteoarthritis, unspecified site: Secondary | ICD-10-CM | POA: Diagnosis not present

## 2016-01-20 DIAGNOSIS — R2681 Unsteadiness on feet: Secondary | ICD-10-CM | POA: Diagnosis not present

## 2016-01-20 DIAGNOSIS — E8881 Metabolic syndrome: Secondary | ICD-10-CM

## 2016-01-20 DIAGNOSIS — G20 Parkinson's disease: Secondary | ICD-10-CM

## 2016-01-20 DIAGNOSIS — Z8673 Personal history of transient ischemic attack (TIA), and cerebral infarction without residual deficits: Secondary | ICD-10-CM

## 2016-01-20 DIAGNOSIS — R443 Hallucinations, unspecified: Secondary | ICD-10-CM | POA: Diagnosis not present

## 2016-01-20 DIAGNOSIS — I10 Essential (primary) hypertension: Secondary | ICD-10-CM

## 2016-01-20 DIAGNOSIS — E785 Hyperlipidemia, unspecified: Secondary | ICD-10-CM | POA: Diagnosis not present

## 2016-01-20 DIAGNOSIS — Z23 Encounter for immunization: Secondary | ICD-10-CM

## 2016-01-20 DIAGNOSIS — E034 Atrophy of thyroid (acquired): Secondary | ICD-10-CM | POA: Diagnosis not present

## 2016-01-20 LAB — LIPID PANEL
CHOL/HDL RATIO: 3.6 ratio (ref 0.0–4.4)
CHOLESTEROL TOTAL: 127 mg/dL (ref 100–199)
HDL: 35 mg/dL — AB (ref >39)
LDL Calculated: 53 mg/dL (ref 0–99)
TRIGLYCERIDES: 195 mg/dL — AB (ref 0–149)
VLDL CHOLESTEROL CAL: 39 mg/dL (ref 5–40)

## 2016-01-20 LAB — CMP14+EGFR
ALK PHOS: 88 IU/L (ref 39–117)
ALT: 17 IU/L (ref 0–32)
AST: 16 IU/L (ref 0–40)
Albumin/Globulin Ratio: 1 — ABNORMAL LOW (ref 1.2–2.2)
Albumin: 3.4 g/dL — ABNORMAL LOW (ref 3.5–4.7)
BUN/Creatinine Ratio: 17 (ref 12–28)
BUN: 15 mg/dL (ref 8–27)
Bilirubin Total: 0.4 mg/dL (ref 0.0–1.2)
CO2: 30 mmol/L — AB (ref 18–29)
CREATININE: 0.89 mg/dL (ref 0.57–1.00)
Calcium: 9.4 mg/dL (ref 8.7–10.3)
Chloride: 98 mmol/L (ref 96–106)
GFR calc Af Amer: 71 mL/min/{1.73_m2} (ref >59)
GFR calc non Af Amer: 61 mL/min/{1.73_m2} (ref >59)
GLOBULIN, TOTAL: 3.3 g/dL (ref 1.5–4.5)
GLUCOSE: 111 mg/dL — AB (ref 65–99)
Potassium: 3.3 mmol/L — ABNORMAL LOW (ref 3.5–5.2)
SODIUM: 144 mmol/L (ref 134–144)
Total Protein: 6.7 g/dL (ref 6.0–8.5)

## 2016-01-20 NOTE — Progress Notes (Signed)
   Subjective:    Patient ID: Katherine Roth, female    DOB: 18-Jul-1933, 82 y.o.   MRN: [id number]  Pt presents to the office today for chronic follow up. PT is a resident of [place].  Hyperlipidemia This is a chronic problem. The current episode started more than 1 year ago. The problem is uncontrolled. Recent lipid tests were reviewed and are high. Current antihyperlipidemic treatment includes statins. Risk factors for coronary artery disease include dyslipidemia, obesity, hypertension, a sedentary lifestyle and post-menopausal.  Hypertension This is a chronic problem. The current episode started more than 1 year ago. The problem has been waxing and waning since onset. The problem is uncontrolled. Pertinent negatives include no anxiety, headaches, palpitations or peripheral edema. Risk factors for coronary artery disease include dyslipidemia, obesity and sedentary lifestyle. Past treatments include diuretics, beta blockers and angiotensin blockers. There is no history of kidney disease, CAD/MI, CVA, heart failure or a thyroid problem. There is no history of sleep apnea.  Thyroid Problem Presents for follow-up visit. Patient reports no constipation, diarrhea, palpitations or weight gain. The symptoms have been stable. Past treatments include levothyroxine. Her past medical history is significant for hyperlipidemia. There is no history of heart failure.  Parkinson Pt currently taking Sinemet. Pt continues to have intermittent tremors, but is doing well.     Review of Systems  Constitutional: Negative for weight gain.  Cardiovascular: Negative for palpitations.  Gastrointestinal: Negative for diarrhea and constipation.  Neurological: Negative for headaches.       Objective:   Physical Exam  Constitutional: She is oriented to person, place, and time. She appears well-developed and well-nourished. No distress.  HENT:  Head: Normocephalic and atraumatic.  Right Ear: External ear  normal.  Mouth/Throat: Oropharynx is clear and moist.  Eyes: Pupils are equal, round, and reactive to light.  Neck: Normal range of motion. Neck supple. No thyromegaly present.  Cardiovascular: Normal rate, regular rhythm, normal heart sounds and intact distal pulses.   No murmur heard. Bradycardia   Pulmonary/Chest: Effort normal and breath sounds normal. No respiratory distress. She has no wheezes.  Abdominal: Soft. Bowel sounds are normal. She exhibits no distension. There is no tenderness.  Musculoskeletal: Normal range of motion. She exhibits no edema or tenderness.  Neurological: She is alert and oriented to person, place, and time.  Skin: Skin is warm and dry.  Psychiatric: She has a normal mood and affect. Her behavior is normal. Judgment and thought content normal.  Flat affect  Vitals reviewed.   BP 176/85 mmHg  Pulse 59  Temp(Src) 97.9 F (36.6 C)  Ht _0  (1.651 m)  Wt 146 lb (66.225 kg)  BMI 24.30 kg/m2       Assessment & Plan:  1. Essential (primary) hypertension - CMP14+EGFR  2. Hypothyroidism due to acquired atrophy of thyroid - CMP14+EGFR  3. Primary Parkinsonism ([place]) - CMP14+EGFR  4. Arthritis - CMP14+EGFR  5. H/O: CVA (cerebrovascular accident) - CMP14+EGFR  6. Gait instability - CMP14+EGFR  7. Hallucinations - CMP14+EGFR  8. Hyperlipidemia - CMP14+EGFR - Lipid panel  9. Metabolic syndrome  - [id number]   Continue all meds Labs pending Health Maintenance reviewed Diet and exercise encouraged RTO 6 months  Evelina Dun, FNP

## 2016-01-20 NOTE — Addendum Note (Signed)
Addended by: Shelbie Ammons on: 01/20/2016 12:39 PM   Modules accepted: Orders

## 2016-01-20 NOTE — Patient Instructions (Signed)
Health Maintenance, Female Adopting a healthy lifestyle and getting preventive care can go a long way to promote health and wellness. Talk with your health care provider about what schedule of regular examinations is right for you. This is a good chance for you to check in with your provider about disease prevention and staying healthy. In between checkups, there are plenty of things you can do on your own. Experts have done a lot of research about which lifestyle changes and preventive measures are most likely to keep you healthy. Ask your health care provider for more information. WEIGHT AND DIET  Eat a healthy diet  Be sure to include plenty of vegetables, fruits, low-fat dairy products, and lean protein.  Do not eat a lot of foods high in solid fats, added sugars, or salt.  Get regular exercise. This is one of the most important things you can do for your health.  Most adults should exercise for at least 150 minutes each week. The exercise should increase your heart rate and make you sweat (moderate-intensity exercise).  Most adults should also do strengthening exercises at least twice a week. This is in addition to the moderate-intensity exercise.  Maintain a healthy weight  Body mass index (BMI) is a measurement that can be used to identify possible weight problems. It estimates body fat based on height and weight. Your health care provider can help determine your BMI and help you achieve or maintain a healthy weight.  For females 20 years of age and older:   A BMI below 18.5 is considered underweight.  A BMI of 18.5 to 24.9 is normal.  A BMI of 25 to 29.9 is considered overweight.  A BMI of 30 and above is considered obese.  Watch levels of cholesterol and blood lipids  You should start having your blood tested for lipids and cholesterol at 80 years of age, then have this test every 5 years.  You may need to have your cholesterol levels checked more often if:  Your lipid  or cholesterol levels are high.  You are older than 80 years of age.  You are at high risk for heart disease.  CANCER SCREENING   Lung Cancer  Lung cancer screening is recommended for adults 55-80 years old who are at high risk for lung cancer because of a history of smoking.  A yearly low-dose CT scan of the lungs is recommended for people who:  Currently smoke.  Have quit within the past 15 years.  Have at least a 30-pack-year history of smoking. A pack year is smoking an average of one pack of cigarettes a day for 1 year.  Yearly screening should continue until it has been 15 years since you quit.  Yearly screening should stop if you develop a health problem that would prevent you from having lung cancer treatment.  Breast Cancer  Practice breast self-awareness. This means understanding how your breasts normally appear and feel.  It also means doing regular breast self-exams. Let your health care provider know about any changes, no matter how small.  If you are in your 20s or 30s, you should have a clinical breast exam (CBE) by a health care provider every 1-3 years as part of a regular health exam.  If you are 40 or older, have a CBE every year. Also consider having a breast X-ray (mammogram) every year.  If you have a family history of breast cancer, talk to your health care provider about genetic screening.  If you   are at high risk for breast cancer, talk to your health care provider about having an MRI and a mammogram every year.  Breast cancer gene (BRCA) assessment is recommended for women who have family members with BRCA-related cancers. BRCA-related cancers include:  Breast.  Ovarian.  Tubal.  Peritoneal cancers.  Results of the assessment will determine the need for genetic counseling and BRCA1 and BRCA2 testing. Cervical Cancer Your health care provider may recommend that you be screened regularly for cancer of the pelvic organs (ovaries, uterus, and  vagina). This screening involves a pelvic examination, including checking for microscopic changes to the surface of your cervix (Pap test). You may be encouraged to have this screening done every 3 years, beginning at age 21.  For women ages 30-65, health care providers may recommend pelvic exams and Pap testing every 3 years, or they may recommend the Pap and pelvic exam, combined with testing for human papilloma virus (HPV), every 5 years. Some types of HPV increase your risk of cervical cancer. Testing for HPV may also be done on women of any age with unclear Pap test results.  Other health care providers may not recommend any screening for nonpregnant women who are considered low risk for pelvic cancer and who do not have symptoms. Ask your health care provider if a screening pelvic exam is right for you.  If you have had past treatment for cervical cancer or a condition that could lead to cancer, you need Pap tests and screening for cancer for at least 20 years after your treatment. If Pap tests have been discontinued, your risk factors (such as having a new sexual partner) need to be reassessed to determine if screening should resume. Some women have medical problems that increase the chance of getting cervical cancer. In these cases, your health care provider may recommend more frequent screening and Pap tests. Colorectal Cancer  This type of cancer can be detected and often prevented.  Routine colorectal cancer screening usually begins at 80 years of age and continues through 80 years of age.  Your health care provider may recommend screening at an earlier age if you have risk factors for colon cancer.  Your health care provider may also recommend using home test kits to check for hidden blood in the stool.  A small camera at the end of a tube can be used to examine your colon directly (sigmoidoscopy or colonoscopy). This is done to check for the earliest forms of colorectal  cancer.  Routine screening usually begins at age 50.  Direct examination of the colon should be repeated every 5-10 years through 80 years of age. However, you may need to be screened more often if early forms of precancerous polyps or small growths are found. Skin Cancer  Check your skin from head to toe regularly.  Tell your health care provider about any new moles or changes in moles, especially if there is a change in a mole's shape or color.  Also tell your health care provider if you have a mole that is larger than the size of a pencil eraser.  Always use sunscreen. Apply sunscreen liberally and repeatedly throughout the day.  Protect yourself by wearing long sleeves, pants, a wide-brimmed hat, and sunglasses whenever you are outside. HEART DISEASE, DIABETES, AND HIGH BLOOD PRESSURE   High blood pressure causes heart disease and increases the risk of stroke. High blood pressure is more likely to develop in:  People who have blood pressure in the high end   of the normal range (130-139/85-89 mm Hg).  People who are overweight or obese.  People who are African American.  If you are 38-23 years of age, have your blood pressure checked every 3-5 years. If you are 61 years of age or older, have your blood pressure checked every year. You should have your blood pressure measured twice--once when you are at a hospital or clinic, and once when you are not at a hospital or clinic. Record the average of the two measurements. To check your blood pressure when you are not at a hospital or clinic, you can use:  An automated blood pressure machine at a pharmacy.  A home blood pressure monitor.  If you are between 45 years and 39 years old, ask your health care provider if you should take aspirin to prevent strokes.  Have regular diabetes screenings. This involves taking a blood sample to check your fasting blood sugar level.  If you are at a normal weight and have a low risk for diabetes,  have this test once every three years after 80 years of age.  If you are overweight and have a high risk for diabetes, consider being tested at a younger age or more often. PREVENTING INFECTION  Hepatitis B  If you have a higher risk for hepatitis B, you should be screened for this virus. You are considered at high risk for hepatitis B if:  You were born in a country where hepatitis B is common. Ask your health care provider which countries are considered high risk.  Your parents were born in a high-risk country, and you have not been immunized against hepatitis B (hepatitis B vaccine).  You have HIV or AIDS.  You use needles to inject street drugs.  You live with someone who has hepatitis B.  You have had sex with someone who has hepatitis B.  You get hemodialysis treatment.  You take certain medicines for conditions, including cancer, organ transplantation, and autoimmune conditions. Hepatitis C  Blood testing is recommended for:  Everyone born from [birth year] through 1963.  Anyone with known risk factors for hepatitis C. Sexually transmitted infections (STIs)  You should be screened for sexually transmitted infections (STIs) including gonorrhea and chlamydia if:  You are sexually active and are younger than 80 years of age.  You are older than 80 years of age and your health care provider tells you that you are at risk for this type of infection.  Your sexual activity has changed since you were last screened and you are at an increased risk for chlamydia or gonorrhea. Ask your health care provider if you are at risk.  If you do not have HIV, but are at risk, it may be recommended that you take a prescription medicine daily to prevent HIV infection. This is called pre-exposure prophylaxis (PrEP). You are considered at risk if:  You are sexually active and do not regularly use condoms or know the HIV status of your partner(s).  You take drugs by injection.  You are sexually  active with a partner who has HIV. Talk with your health care provider about whether you are at high risk of being infected with HIV. If you choose to begin PrEP, you should first be tested for HIV. You should then be tested every 3 months for as long as you are taking PrEP.  PREGNANCY   If you are premenopausal and you may become pregnant, ask your health care provider about preconception counseling.  If you may  become pregnant, take 400 to 800 micrograms (mcg) of folic acid every day.  If you want to prevent pregnancy, talk to your health care provider about birth control (contraception). OSTEOPOROSIS AND MENOPAUSE   Osteoporosis is a disease in which the bones lose minerals and strength with aging. This can result in serious bone fractures. Your risk for osteoporosis can be identified using a bone density scan.  If you are 61 years of age or older, or if you are at risk for osteoporosis and fractures, ask your health care provider if you should be screened.  Ask your health care provider whether you should take a calcium or vitamin D supplement to lower your risk for osteoporosis.  Menopause may have certain physical symptoms and risks.  Hormone replacement therapy may reduce some of these symptoms and risks. Talk to your health care provider about whether hormone replacement therapy is right for you.  HOME CARE INSTRUCTIONS   Schedule regular health, dental, and eye exams.  Stay current with your immunizations.   Do not use any tobacco products including cigarettes, chewing tobacco, or electronic cigarettes.  If you are pregnant, do not drink alcohol.  If you are breastfeeding, limit how much and how often you drink alcohol.  Limit alcohol intake to no more than 1 drink per day for nonpregnant women. One drink equals 12 ounces of beer, 5 ounces of wine, or 1 ounces of hard liquor.  Do not use street drugs.  Do not share needles.  Ask your health care provider for help if  you need support or information about quitting drugs.  Tell your health care provider if you often feel depressed.  Tell your health care provider if you have ever been abused or do not feel safe at home.   This information is not intended to replace advice given to you by your health care provider. Make sure you discuss any questions you have with your health care provider.   Document Released: 04/10/2011 Document Revised: 10/16/2014 Document Reviewed: 08/27/2013 Elsevier Interactive Patient Education [employer].

## 2016-01-24 ENCOUNTER — Other Ambulatory Visit: Payer: Self-pay | Admitting: Family

## 2016-01-24 DIAGNOSIS — E876 Hypokalemia: Secondary | ICD-10-CM

## 2016-01-24 MED ORDER — POTASSIUM CHLORIDE ER 10 MEQ PO CPCR: 10.0000 meq | ORAL_CAPSULE | Freq: Two times a day (BID) | ORAL | Status: DC

## 2016-03-28 DIAGNOSIS — R32 Unspecified urinary incontinence: Secondary | ICD-10-CM | POA: Diagnosis not present

## 2016-07-24 ENCOUNTER — Ambulatory Visit (INDEPENDENT_AMBULATORY_CARE_PROVIDER_SITE_OTHER): Payer: Medicare Other | Admitting: Family

## 2016-07-24 ENCOUNTER — Encounter: Payer: Self-pay | Admitting: Family

## 2016-07-24 ENCOUNTER — Ambulatory Visit: Payer: Medicare Other | Admitting: Family

## 2016-07-24 VITALS — BP 157/64 | HR 55 | Temp 97.6°F | Ht 65.0 in | Wt 146.6 lb

## 2016-07-24 DIAGNOSIS — E876 Hypokalemia: Secondary | ICD-10-CM

## 2016-07-24 DIAGNOSIS — R413 Other amnesia: Secondary | ICD-10-CM

## 2016-07-24 DIAGNOSIS — G20 Parkinson's disease: Secondary | ICD-10-CM | POA: Diagnosis not present

## 2016-07-24 DIAGNOSIS — I10 Essential (primary) hypertension: Secondary | ICD-10-CM | POA: Diagnosis not present

## 2016-07-24 DIAGNOSIS — E034 Atrophy of thyroid (acquired): Secondary | ICD-10-CM | POA: Diagnosis not present

## 2016-07-24 NOTE — Progress Notes (Signed)
Subjective:    Patient ID: Katherine Roth, female    DOB: 04-12-1933, 82 y.o.   MRN: [id number]  Pt presents to the office today for chronic follow up. PT is a resident of [place]. Pt is HOH.   Hypertension  This is a chronic problem. The current episode started more than 1 year ago. The problem has been waxing and waning since onset. The problem is uncontrolled. Pertinent negatives include no anxiety, headaches, palpitations or peripheral edema. Risk factors for coronary artery disease include dyslipidemia, obesity and sedentary lifestyle. Past treatments include diuretics, beta blockers and angiotensin blockers. There is no history of kidney disease, CAD/MI, CVA, heart failure or a thyroid problem. There is no history of sleep apnea.  Hyperlipidemia  This is a chronic problem. The current episode started more than 1 year ago. The problem is controlled. Recent lipid tests were reviewed and are normal. Exacerbating diseases include hypothyroidism. Current antihyperlipidemic treatment includes statins. Risk factors for coronary artery disease include dyslipidemia, obesity, hypertension, a sedentary lifestyle and post-menopausal.  Thyroid Problem  Presents for follow-up visit. Symptoms include tremors. Patient reports no constipation, diarrhea, palpitations or weight gain. The symptoms have been stable. Past treatments include levothyroxine. Her past medical history is significant for hyperlipidemia. There is no history of heart failure.  Parkinson Pt currently taking Sinemet. Pt continues to have intermittent tremors, but is doing well. Memory Deficit PT currently taking Aricept. PT able to answer questions.    Review of Systems  Constitutional: Negative for weight gain.  Cardiovascular: Negative for palpitations.  Gastrointestinal: Negative for constipation and diarrhea.  Neurological: Positive for tremors. Negative for headaches.  All other systems reviewed and are negative.        Objective:   Physical Exam  Constitutional: She is oriented to person, place, and time. She appears well-developed and well-nourished. No distress.  HENT:  Head: Normocephalic and atraumatic.  Right Ear: External ear normal.  Left Ear: External ear normal.  Nose: Nose normal.  Mouth/Throat: Oropharynx is clear and moist.  HOH   Eyes: Pupils are equal, round, and reactive to light.  Neck: Normal range of motion. Neck supple. No thyromegaly present.  Cardiovascular: Normal rate, regular rhythm, normal heart sounds and intact distal pulses.   No murmur heard. Bradycardia   Pulmonary/Chest: Effort normal and breath sounds normal. No respiratory distress. She has no wheezes.  Abdominal: Soft. Bowel sounds are normal. She exhibits no distension. There is no tenderness.  Musculoskeletal: Normal range of motion. She exhibits no edema or tenderness.  Neurological: She is alert and oriented to person, place, and time.  Skin: Skin is warm and dry.  Psychiatric: She has a normal mood and affect. Her behavior is normal. Judgment and thought content normal.  Flat affect  Vitals reviewed.   BP (!) 157/64   Pulse (!) 55   Temp 97.6 F (36.4 C) (Oral)   Ht '5\' 5"'$  (1.651 m)   Wt 146 lb 9.6 oz (66.5 kg)   BMI 24.40 kg/m        Assessment & Plan:   1. Essential (primary) hypertension - CMP14+EGFR  2. Hypothyroidism due to acquired atrophy of thyroid - CMP14+EGFR - Thyroid Panel With TSH  3. Primary Parkinsonism ([facility]) - CMP14+EGFR  4. Hypokalemia - CMP14+EGFR  5. Memory deficit -Caregiver will get daughter to call me to discuss medications. We need to increase Aricept to 10 mg and add namenda. - CMP14+EGFR   Continue all meds Labs pending Health Maintenance  reviewed Diet and exercise encouraged RTO 6 months  Evelina Dun, FNP

## 2016-07-24 NOTE — Patient Instructions (Signed)
Good to see you today!! I hope you have a good day.

## 2016-07-25 ENCOUNTER — Other Ambulatory Visit: Payer: Self-pay | Admitting: Family

## 2016-07-25 DIAGNOSIS — H6122 Impacted cerumen, left ear: Secondary | ICD-10-CM | POA: Diagnosis not present

## 2016-07-25 DIAGNOSIS — H906 Mixed conductive and sensorineural hearing loss, bilateral: Secondary | ICD-10-CM | POA: Diagnosis not present

## 2016-07-25 DIAGNOSIS — H903 Sensorineural hearing loss, bilateral: Secondary | ICD-10-CM | POA: Diagnosis not present

## 2016-07-25 LAB — CMP14+EGFR
ALK PHOS: 93 IU/L (ref 39–117)
ALT: 9 IU/L (ref 0–32)
AST: 28 IU/L (ref 0–40)
Albumin/Globulin Ratio: 1.1 — ABNORMAL LOW (ref 1.2–2.2)
Albumin: 3.6 g/dL (ref 3.5–4.7)
BUN/Creatinine Ratio: 12 (ref 12–28)
BUN: 14 mg/dL (ref 8–27)
Bilirubin Total: 0.5 mg/dL (ref 0.0–1.2)
CO2: 32 mmol/L — AB (ref 18–29)
CREATININE: 1.18 mg/dL — AB (ref 0.57–1.00)
Calcium: 9.6 mg/dL (ref 8.7–10.3)
Chloride: 96 mmol/L (ref 96–106)
GFR calc Af Amer: 50 mL/min/{1.73_m2} — ABNORMAL LOW (ref >59)
GFR calc non Af Amer: 43 mL/min/{1.73_m2} — ABNORMAL LOW (ref >59)
GLOBULIN, TOTAL: 3.3 g/dL (ref 1.5–4.5)
GLUCOSE: 105 mg/dL — AB (ref 65–99)
Potassium: 3.7 mmol/L (ref 3.5–5.2)
SODIUM: 140 mmol/L (ref 134–144)
Total Protein: 6.9 g/dL (ref 6.0–8.5)

## 2016-07-25 LAB — THYROID PANEL WITH TSH
Free Thyroxine Index: 3.2 (ref 1.2–4.9)
T3 Uptake Ratio: 32 % (ref 24–39)
T4 TOTAL: 10.1 ug/dL (ref 4.5–12.0)
TSH: 0.015 u[IU]/mL — AB (ref 0.450–4.500)

## 2016-07-25 MED ORDER — LEVOTHYROXINE SODIUM 112 MCG PO TABS: 112.0000 ug | ORAL_TABLET | Freq: Every day | ORAL | 1 refills | Status: DC

## 2016-08-01 ENCOUNTER — Telehealth: Payer: Self-pay | Admitting: Family

## 2016-08-01 NOTE — Telephone Encounter (Signed)
Spoke with Jinny Blossom at [facility], last flu shot here was in 2010.  Patient received flu shot at [facility] today per [place].

## 2016-11-03 ENCOUNTER — Emergency Department (HOSPITAL_COMMUNITY): Payer: Medicare Other

## 2016-11-03 ENCOUNTER — Inpatient Hospital Stay (HOSPITAL_COMMUNITY)
Admission: EM | Admit: 2016-11-03 | Discharge: 2016-11-06 | DRG: 066 | Disposition: A | Discharge status: Home Health | Payer: Medicare Other | Attending: Internal Medicine | Admitting: Internal Medicine

## 2016-11-03 ENCOUNTER — Encounter (HOSPITAL_COMMUNITY): Payer: Self-pay | Admitting: Emergency Medicine

## 2016-11-03 DIAGNOSIS — Z79899 Other long term (current) drug therapy: Secondary | ICD-10-CM | POA: Diagnosis not present

## 2016-11-03 DIAGNOSIS — I6789 Other cerebrovascular disease: Secondary | ICD-10-CM | POA: Diagnosis not present

## 2016-11-03 DIAGNOSIS — Z8249 Family history of ischemic heart disease and other diseases of the circulatory system: Secondary | ICD-10-CM | POA: Diagnosis not present

## 2016-11-03 DIAGNOSIS — M6281 Muscle weakness (generalized): Secondary | ICD-10-CM

## 2016-11-03 DIAGNOSIS — R413 Other amnesia: Secondary | ICD-10-CM | POA: Diagnosis present

## 2016-11-03 DIAGNOSIS — R531 Weakness: Secondary | ICD-10-CM | POA: Diagnosis not present

## 2016-11-03 DIAGNOSIS — Z820 Family history of epilepsy and other diseases of the nervous system: Secondary | ICD-10-CM

## 2016-11-03 DIAGNOSIS — E034 Atrophy of thyroid (acquired): Secondary | ICD-10-CM | POA: Diagnosis not present

## 2016-11-03 DIAGNOSIS — I63139 Cerebral infarction due to embolism of unspecified carotid artery: Secondary | ICD-10-CM

## 2016-11-03 DIAGNOSIS — F329 Major depressive disorder, single episode, unspecified: Secondary | ICD-10-CM | POA: Diagnosis present

## 2016-11-03 DIAGNOSIS — R930 Abnormal findings on diagnostic imaging of skull and head, not elsewhere classified: Secondary | ICD-10-CM | POA: Diagnosis not present

## 2016-11-03 DIAGNOSIS — R269 Unspecified abnormalities of gait and mobility: Secondary | ICD-10-CM | POA: Diagnosis present

## 2016-11-03 DIAGNOSIS — M1990 Unspecified osteoarthritis, unspecified site: Secondary | ICD-10-CM | POA: Diagnosis present

## 2016-11-03 DIAGNOSIS — R32 Unspecified urinary incontinence: Secondary | ICD-10-CM | POA: Diagnosis present

## 2016-11-03 DIAGNOSIS — E039 Hypothyroidism, unspecified: Secondary | ICD-10-CM | POA: Diagnosis not present

## 2016-11-03 DIAGNOSIS — E785 Hyperlipidemia, unspecified: Secondary | ICD-10-CM | POA: Diagnosis present

## 2016-11-03 DIAGNOSIS — N3000 Acute cystitis without hematuria: Secondary | ICD-10-CM | POA: Diagnosis not present

## 2016-11-03 DIAGNOSIS — G20C Parkinsonism, unspecified: Secondary | ICD-10-CM | POA: Diagnosis present

## 2016-11-03 DIAGNOSIS — Z853 Personal history of malignant neoplasm of breast: Secondary | ICD-10-CM | POA: Diagnosis not present

## 2016-11-03 DIAGNOSIS — R404 Transient alteration of awareness: Secondary | ICD-10-CM | POA: Diagnosis not present

## 2016-11-03 DIAGNOSIS — R251 Tremor, unspecified: Secondary | ICD-10-CM | POA: Diagnosis present

## 2016-11-03 DIAGNOSIS — I10 Essential (primary) hypertension: Secondary | ICD-10-CM | POA: Diagnosis present

## 2016-11-03 DIAGNOSIS — G20 Parkinson's disease: Secondary | ICD-10-CM | POA: Diagnosis not present

## 2016-11-03 DIAGNOSIS — I6302 Cerebral infarction due to thrombosis of basilar artery: Secondary | ICD-10-CM | POA: Diagnosis not present

## 2016-11-03 DIAGNOSIS — I639 Cerebral infarction, unspecified: Secondary | ICD-10-CM | POA: Diagnosis not present

## 2016-11-03 DIAGNOSIS — N390 Urinary tract infection, site not specified: Secondary | ICD-10-CM

## 2016-11-03 DIAGNOSIS — I63012 Cerebral infarction due to thrombosis of left vertebral artery: Secondary | ICD-10-CM | POA: Diagnosis not present

## 2016-11-03 DIAGNOSIS — R1312 Dysphagia, oropharyngeal phase: Secondary | ICD-10-CM

## 2016-11-03 DIAGNOSIS — R410 Disorientation, unspecified: Secondary | ICD-10-CM | POA: Diagnosis present

## 2016-11-03 DIAGNOSIS — I6930 Unspecified sequelae of cerebral infarction: Secondary | ICD-10-CM | POA: Diagnosis present

## 2016-11-03 DIAGNOSIS — Z7982 Long term (current) use of aspirin: Secondary | ICD-10-CM | POA: Diagnosis not present

## 2016-11-03 DIAGNOSIS — F0280 Dementia in other diseases classified elsewhere without behavioral disturbance: Secondary | ICD-10-CM | POA: Diagnosis present

## 2016-11-03 DIAGNOSIS — R2681 Unsteadiness on feet: Secondary | ICD-10-CM | POA: Diagnosis present

## 2016-11-03 DIAGNOSIS — R1111 Vomiting without nausea: Secondary | ICD-10-CM | POA: Diagnosis not present

## 2016-11-03 DIAGNOSIS — C50919 Malignant neoplasm of unspecified site of unspecified female breast: Secondary | ICD-10-CM | POA: Diagnosis present

## 2016-11-03 DIAGNOSIS — I6523 Occlusion and stenosis of bilateral carotid arteries: Secondary | ICD-10-CM | POA: Diagnosis not present

## 2016-11-03 HISTORY — DX: Unspecified osteoarthritis, unspecified site: M19.90

## 2016-11-03 HISTORY — DX: Unspecified dementia, unspecified severity, without behavioral disturbance, psychotic disturbance, mood disturbance, and anxiety: F03.90

## 2016-11-03 LAB — COMPREHENSIVE METABOLIC PANEL
ALK PHOS: 89 U/L (ref 38–126)
ALT: 7 U/L — ABNORMAL LOW (ref 14–54)
ANION GAP: 9 (ref 5–15)
AST: 17 U/L (ref 15–41)
Albumin: 3.6 g/dL (ref 3.5–5.0)
BILIRUBIN TOTAL: 1.1 mg/dL (ref 0.3–1.2)
BUN: 12 mg/dL (ref 6–20)
CALCIUM: 9.8 mg/dL (ref 8.9–10.3)
CO2: 35 mmol/L — ABNORMAL HIGH (ref 22–32)
Chloride: 94 mmol/L — ABNORMAL LOW (ref 101–111)
Creatinine, Ser: 1.11 mg/dL — ABNORMAL HIGH (ref 0.44–1.00)
GFR calc Af Amer: 53 mL/min — ABNORMAL LOW (ref >60)
GFR, EST NON AFRICAN AMERICAN: 45 mL/min — AB (ref >60)
GLUCOSE: 122 mg/dL — AB (ref 65–99)
POTASSIUM: 3.4 mmol/L — AB (ref 3.5–5.1)
Sodium: 138 mmol/L (ref 135–145)
TOTAL PROTEIN: 8.2 g/dL — AB (ref 6.5–8.1)

## 2016-11-03 LAB — CBC WITH DIFFERENTIAL/PLATELET
BASOS ABS: 0.1 10*3/uL (ref 0.0–0.1)
BASOS PCT: 1 %
EOS ABS: 0.5 10*3/uL (ref 0.0–0.7)
Eosinophils Relative: 4 %
HEMATOCRIT: 45.6 % (ref 36.0–46.0)
Hemoglobin: 16.1 g/dL — ABNORMAL HIGH (ref 12.0–15.0)
Lymphocytes Relative: 25 %
Lymphs Abs: 3.3 10*3/uL (ref 0.7–4.0)
MCH: 30.4 pg (ref 26.0–34.0)
MCHC: 35.3 g/dL (ref 30.0–36.0)
MCV: 86.2 fL (ref 78.0–100.0)
MONO ABS: 1.4 10*3/uL — AB (ref 0.1–1.0)
Monocytes Relative: 10 %
NEUTROS ABS: 8 10*3/uL — AB (ref 1.7–7.7)
NEUTROS PCT: 60 %
Platelets: 317 10*3/uL (ref 150–400)
RBC: 5.29 MIL/uL — ABNORMAL HIGH (ref 3.87–5.11)
RDW: 12.5 % (ref 11.5–15.5)
WBC: 13.2 10*3/uL — ABNORMAL HIGH (ref 4.0–10.5)

## 2016-11-03 LAB — LIPASE, BLOOD: LIPASE: 27 U/L (ref 11–51)

## 2016-11-03 LAB — URINALYSIS, ROUTINE W REFLEX MICROSCOPIC
BILIRUBIN URINE: NEGATIVE
GLUCOSE, UA: NEGATIVE mg/dL
Ketones, ur: NEGATIVE mg/dL
NITRITE: NEGATIVE
PH: 5 (ref 5.0–8.0)
Protein, ur: NEGATIVE mg/dL
Specific Gravity, Urine: 1.006 (ref 1.005–1.030)

## 2016-11-03 LAB — TROPONIN I

## 2016-11-03 MED ORDER — HEPARIN SODIUM (PORCINE) 5000 UNIT/ML IJ SOLN
5000.0000 [IU] | Freq: Three times a day (TID) | INTRAMUSCULAR | Status: DC
  Filled 2016-11-03: qty 1

## 2016-11-03 MED ORDER — FLUOXETINE HCL 20 MG PO CAPS
20.0000 mg | ORAL_CAPSULE | Freq: Every day | ORAL | Status: DC
  Administered 2016-11-04 – 2016-11-06 (×3): 20 mg via ORAL
  Filled 2016-11-03 (×3): qty 1

## 2016-11-03 MED ORDER — ASPIRIN EC 81 MG PO TBEC
81.0000 mg | DELAYED_RELEASE_TABLET | Freq: Every day | ORAL | Status: DC
  Administered 2016-11-04: 81 mg via ORAL
  Filled 2016-11-03: qty 1

## 2016-11-03 MED ORDER — GADOBENATE DIMEGLUMINE 529 MG/ML IV SOLN
13.0000 mL | Freq: Once | INTRAVENOUS | Status: AC | PRN
  Administered 2016-11-03: 13 mL via INTRAVENOUS

## 2016-11-03 MED ORDER — ASPIRIN 300 MG RE SUPP
300.0000 mg | Freq: Once | RECTAL | Status: AC
  Administered 2016-11-03: 300 mg via RECTAL
  Filled 2016-11-03: qty 1

## 2016-11-03 MED ORDER — CARBIDOPA-LEVODOPA 25-100 MG PO TABS
1.0000 | ORAL_TABLET | Freq: Three times a day (TID) | ORAL | Status: DC
Start: 2016-11-03 — End: 2016-11-06
  Administered 2016-11-04 – 2016-11-05 (×5): 1 via ORAL
  Filled 2016-11-03 (×6): qty 1

## 2016-11-03 MED ORDER — SENNOSIDES-DOCUSATE SODIUM 8.6-50 MG PO TABS: 1.0000 | ORAL_TABLET | Freq: Every evening | ORAL | Status: DC | PRN

## 2016-11-03 MED ORDER — ACETAMINOPHEN 325 MG PO TABS: 650.0000 mg | ORAL_TABLET | ORAL | Status: DC | PRN

## 2016-11-03 MED ORDER — LEVOTHYROXINE SODIUM 112 MCG PO TABS: 112.0000 ug | ORAL_TABLET | Freq: Every day | ORAL | Status: DC

## 2016-11-03 MED ORDER — HEPARIN SODIUM (PORCINE) 5000 UNIT/ML IJ SOLN
5000.0000 [IU] | Freq: Three times a day (TID) | INTRAMUSCULAR | Status: DC
  Administered 2016-11-04 – 2016-11-06 (×7): 5000 [IU] via SUBCUTANEOUS
  Filled 2016-11-03 (×6): qty 1

## 2016-11-03 MED ORDER — ACETAMINOPHEN 650 MG RE SUPP: 650.0000 mg | RECTAL | Status: DC | PRN

## 2016-11-03 MED ORDER — STROKE: EARLY STAGES OF RECOVERY BOOK
Freq: Once | Status: AC
  Administered 2016-11-05: 19:00:00
  Filled 2016-11-03: qty 1

## 2016-11-03 MED ORDER — RISPERIDONE 0.5 MG PO TABS
0.2500 mg | ORAL_TABLET | Freq: Every day | ORAL | Status: DC
  Administered 2016-11-04 – 2016-11-05 (×2): 0.25 mg via ORAL
  Filled 2016-11-03 (×3): qty 1

## 2016-11-03 MED ORDER — LEVOTHYROXINE SODIUM 100 MCG IV SOLR
75.0000 ug | Freq: Every day | INTRAVENOUS | Status: DC
  Administered 2016-11-04 – 2016-11-06 (×3): 75 ug via INTRAVENOUS
  Filled 2016-11-03 (×5): qty 5

## 2016-11-03 MED ORDER — DONEPEZIL HCL 5 MG PO TABS
5.0000 mg | ORAL_TABLET | Freq: Every day | ORAL | Status: DC
  Administered 2016-11-04 – 2016-11-05 (×2): 5 mg via ORAL
  Filled 2016-11-03 (×3): qty 1

## 2016-11-03 MED ORDER — ASPIRIN 81 MG PO CHEW: 324.0000 mg | CHEWABLE_TABLET | Freq: Once | ORAL | Status: DC

## 2016-11-03 MED ORDER — QUETIAPINE FUMARATE 25 MG PO TABS
25.0000 mg | ORAL_TABLET | Freq: Every day | ORAL | Status: DC
  Administered 2016-11-04 – 2016-11-05 (×2): 25 mg via ORAL
  Filled 2016-11-03 (×3): qty 1

## 2016-11-03 MED ORDER — DEXTROSE 5 % IV SOLN
1.0000 g | INTRAVENOUS | Status: DC
  Administered 2016-11-03: 1 g via INTRAVENOUS
  Filled 2016-11-03 (×3): qty 10

## 2016-11-03 MED ORDER — ACETAMINOPHEN 160 MG/5ML PO SOLN: 650.0000 mg | ORAL | Status: DC | PRN

## 2016-11-03 NOTE — ED Notes (Signed)
I assumed care for this pt at [place] and neuro checks were not done.

## 2016-11-03 NOTE — ED Notes (Signed)
Placed patient on bed alarm with assistance from nurse upon return from CT

## 2016-11-03 NOTE — H&P (Signed)
History and Physical    Katherine Roth [id number] DOB: Aug 10, 1933 DOA: 11/03/2016  PCP: Redge Gainer, MD  Patient coming from: [facility]  Chief Complaint: Vomiting  HPI: Katherine Roth is a 83 y.o. female with medical history significant of HTN, HLD, Parkinson's dementia, hypothyroidism was sent to the ED from her nursing home ([facility]) for evaluation of episode of non bloody vomiting and confusion.  Hx is very limited and is per ED staff. I attempted to call the facility and POA but no response. Apparently the nurse who took care of her at the facility had gone for the day.  Patient had an episode of nonbloody vomiting earlier and seemed confused per the nurse therefore sent here. She also had 2 episodes of urinary incontinence. Her baseline mental status is unknown at this time.   In the ED due to her confusion, CT head was done which showed possible CVA and it was confirmed with the MRI. She has acute 75mm infarct in the right superior cerebellar peduncle/cerebellar junction with chronic blood disease. Case was discussed with Dr Merlene Laughter who recommended admitting here at [facility] for further care and doesn't need to go to [facility].  Her UA showed mild UTI with mild leukocytosis on her CBC. She was started on Rocephin.   Her Code status is unknown at this time due to her mental status and unable to reach POA, [name].   Review of Systems: Unable to fully obtain due to her mental status.     Past Medical History:  Diagnosis Date  . Arthritis   . Asthma   . Breast cancer ([place])   . Dementia   . Depression   . Dizziness   . Hallucination   . Hyperlipidemia   . Hypertension   . Hypothyroidism   . Macular degeneration     Past Surgical History:  Procedure Laterality Date  . ABDOMINAL HYSTERECTOMY    . APPENDECTOMY    . BREAST SURGERY    . LUMBAR DISC SURGERY    . MASTECTOMY Right 04/2014     reports that she has never smoked.  She has never used smokeless tobacco. She reports that she does not drink alcohol or use drugs.  No Known Allergies  Family History  Problem Relation Age of Onset  . Heart disease Mother     CHF  . Parkinson's disease Mother   . Osteoporosis Mother   . Heart disease Father     CABG, died age 30  . Hypertension Father   . Heart disease Paternal Grandmother     Prior to Admission medications   Medication Sig Start Date End Date Taking? Authorizing Provider  aspirin 81 MG tablet Take 81 mg by mouth daily.   Yes Historical Provider, MD  atenolol-chlorthalidone (TENORETIC) 100-25 MG per tablet Take 1 tablet by mouth daily.   Yes Historical Provider, MD  carbidopa-levodopa (SINEMET IR) 25-100 MG per tablet TAKE 1 TABLET BY MOUTH 3 (THREE) TIMES DAILY. 10/01/14  Yes Marcial Pacas, MD  donepezil (ARICEPT) 5 MG tablet Take 5 mg by mouth at bedtime.    Yes Historical Provider, MD  FLUoxetine (PROZAC) 20 MG capsule TAKE 1 CAPSULE EVERY DAY 07/15/14  Yes Lysbeth Penner, FNP  levothyroxine (SYNTHROID) 112 MCG tablet Take 1 tablet (112 mcg total) by mouth daily before breakfast. 07/25/16  Yes Sharion Balloon, FNP  losartan (COZAAR) 50 MG tablet TAKE 1 TABLET BY MOUTH EVERY DAY 08/11/14  Yes Mary-Margaret Hassell Done, FNP  potassium chloride (MICRO-K) 10 MEQ CR capsule Take 1 capsule (10 mEq total) by mouth 2 (two) times daily. 01/24/16  Yes Sharion Balloon, FNP  QUEtiapine (SEROQUEL) 25 MG tablet TAKE 1 TABLET (25 MG TOTAL) BY MOUTH AT BEDTIME. 10/08/14  Yes Kathrynn Ducking, MD  risperiDONE (RISPERDAL) 0.25 MG tablet TAKE 1 TABLET (0.25 MG TOTAL) BY MOUTH AT BEDTIME. 08/28/14  Yes Mary-Margaret Hassell Done, FNP    Physical Exam: Vitals:   11/03/16 1830 11/03/16 1900 11/03/16 1930 11/03/16 2030  BP: 186/61 188/72 148/62 (!) 207/61  Pulse: (!) 49 62 (!) 57 (!) 54  Resp: 14 18 15 18   Temp:      TempSrc:      SpO2: 98% 98% 98% 96%  Weight:      Height:          Constitutional: NAD, calm, comfortable but  frail appearing Vitals:   11/03/16 1830 11/03/16 1900 11/03/16 1930 11/03/16 2030  BP: 186/61 188/72 148/62 (!) 207/61  Pulse: (!) 49 62 (!) 57 (!) 54  Resp: 14 18 15 18   Temp:      TempSrc:      SpO2: 98% 98% 98% 96%  Weight:      Height:       Eyes: PERRL, lids and conjunctivae normal ENMT: Mucous membranes are moist. Posterior pharynx clear of any exudate or lesions.Normal dentition.  Neck: normal, supple, no masses, no thyromegaly Respiratory: clear to auscultation bilaterally, no wheezing, no crackles. Normal respiratory effort. No accessory muscle use.  Cardiovascular: Regular rate and rhythm, no murmurs / rubs / gallops. No extremity edema. 2+ pedal pulses. No carotid bruits.  Abdomen: no tenderness, no masses palpated. No hepatosplenomegaly. Bowel sounds positive.  Musculoskeletal: no clubbing / cyanosis. No joint deformity upper and lower extremities. Good ROM, no contractures. Normal muscle tone.  Skin:chronic skin bruising Neurologic: unable to perform due to her mental status Psychiatric: unable to perform. She is awake and alert but non verbal at this time and pleasantly confused.   Labs on Admission: I have personally reviewed following labs and imaging studies  CBC:  Recent Labs Lab 11/03/16 1546  WBC 13.2*  NEUTROABS 8.0*  HGB 16.1*  HCT 45.6  MCV 86.2  PLT [id number]   Basic Metabolic Panel:  Recent Labs Lab 11/03/16 1546  NA 138  K 3.4*  CL 94*  CO2 35*  GLUCOSE 122*  BUN 12  CREATININE 1.11*  CALCIUM 9.8   GFR: Estimated Creatinine Clearance: 36.3 mL/min (by C-G formula based on SCr of 1.11 mg/dL (H)). Liver Function Tests:  Recent Labs Lab 11/03/16 1546  AST 17  ALT 7*  ALKPHOS 89  BILITOT 1.1  PROT 8.2*  ALBUMIN 3.6    Recent Labs Lab 11/03/16 1546  LIPASE 27   No results for input(s): AMMONIA in the last 168 hours. Coagulation Profile: No results for input(s): INR, PROTIME in the last 168 hours. Cardiac Enzymes:  Recent  Labs Lab 11/03/16 1546  TROPONINI <0.03   BNP (last 3 results) No results for input(s): PROBNP in the last 8760 hours. HbA1C: No results for input(s): HGBA1C in the last 72 hours. CBG: No results for input(s): GLUCAP in the last 168 hours. Lipid Profile: No results for input(s): CHOL, HDL, LDLCALC, TRIG, CHOLHDL, LDLDIRECT in the last 72 hours. Thyroid Function Tests: No results for input(s): TSH, T4TOTAL, FREET4, T3FREE, THYROIDAB in the last 72 hours. Anemia Panel: No results for input(s): VITAMINB12, FOLATE, FERRITIN, TIBC,  IRON, RETICCTPCT in the last 72 hours. Urine analysis:    Component Value Date/Time   COLORURINE STRAW (A) 11/03/2016 1540   APPEARANCEUR HAZY (A) 11/03/2016 1540   LABSPEC 1.006 11/03/2016 1540   PHURINE 5.0 11/03/2016 1540   GLUCOSEU NEGATIVE 11/03/2016 1540   HGBUR SMALL (A) 11/03/2016 1540   BILIRUBINUR NEGATIVE 11/03/2016 1540   BILIRUBINUR neg 10/19/2014 1635   KETONESUR NEGATIVE 11/03/2016 1540   PROTEINUR NEGATIVE 11/03/2016 1540   UROBILINOGEN negative 10/19/2014 1635   UROBILINOGEN 0.2 03/28/2010 0530   NITRITE NEGATIVE 11/03/2016 1540   LEUKOCYTESUR MODERATE (A) 11/03/2016 1540   Sepsis Labs: !!!!!!!!!!!!!!!!!!!!!!!!!!!!!!!!!!!!!!!!!!!! @LABRCNTIP (procalcitonin:4,lacticidven:4) )No results found for this or any previous visit (from the past 240 hour(s)).   Radiological Exams on Admission: Dg Chest 2 View  Result Date: 11/03/2016 CLINICAL DATA:  Weakness, vomiting EXAM: CHEST  2 VIEW COMPARISON:  01/30/2014 FINDINGS: Mild patchy left lower lobe opacity, possibly reflecting chronic scarring when correlating with 2015, although superimposed pneumonia is not excluded. No pleural effusion or pneumothorax. Cardiomegaly. Visualized osseous structures are within normal limits. IMPRESSION: Mild patchy left lower lobe opacity, possibly chronic scarring, superimposed pneumonia not excluded. Electronically Signed   By: Julian Hy M.D.   On:  11/03/2016 16:59   Ct Head Wo Contrast  Result Date: 11/03/2016 CLINICAL DATA:  Altered mental status and vomiting. History of breast cancer. EXAM: CT HEAD WITHOUT CONTRAST TECHNIQUE: Contiguous axial images were obtained from the base of the skull through the vertex without intravenous contrast. COMPARISON:  03/28/2010 FINDINGS: Brain: No evidence of hemorrhage, hydrocephalus, extra-axial collection or mass effect. There is moderate brain parenchymal volume loss. Areas of hypoattenuation is seen in the subcortical white matter, most prominent in the left frontal lobe fold by a right frontal and right parietal lobes. Vascular: Calcific atherosclerotic disease at the skullbase. Skull: Normal. Negative for fracture or focal lesion. Sinuses/Orbits: No acute finding. Other: None. IMPRESSION: Areas of hypoattenuation in the subcortical white matter in left frontal, right frontal and right parietal lobes. These may represent areas of microangiopathy, however age-indeterminate, potentially watershed infarcts, or vasogenic edema cannot be excluded (particularly given patient's history of breast cancer). Further evaluation with brain MRI with contrast may be considered, if found clinically indicated. Electronically Signed   By: Fidela Salisbury M.D.   On: 11/03/2016 17:24   Mr Jeri Cos [id number] Contrast  Result Date: 11/03/2016 CLINICAL DATA:  83 y/o F; history of breast cancer with abnormal CT of the head. EXAM: MRI HEAD WITHOUT AND WITH CONTRAST TECHNIQUE: Multiplanar, multiecho pulse sequences of the brain and surrounding structures were obtained without and with intravenous contrast. CONTRAST:  66mL MULTIHANCE GADOBENATE DIMEGLUMINE 529 MG/ML IV SOLN COMPARISON:  11/03/2016 CT head. FINDINGS: Brain: 6 mm focus of diffusion restriction within the right superior cerebellar peduncle/cerebellar junction (series 5, image 53) without enhanced. Small chronic infarct in the left superior frontal lobe. Scattered foci of T2  FLAIR hyperintensity within the pons, right caudate head, and bilateral lentiform nuclei greater on the left are compatible with chronic lacunar infarcts. Extensive prominent perivascular spaces of the basal ganglia. Background of moderate chronic microvascular ischemic changes and parenchymal volume loss of the brain. No evidence of intracranial hemorrhage, focal mass effect, extra-axial collection. No hydrocephalus. No abnormal enhancement of the brain. Vascular: Normal flow voids. Skull and upper cervical spine: Normal marrow signal. Sinuses/Orbits: Negative. Other: None. IMPRESSION: 1. 6 mm acute/early subacute infarct within the right superior cerebellar peduncle/cerebellar junction. No acute intracranial hemorrhage. 2. No evidence of intracranial  metastatic disease. 3. Foci of hypoattenuation on CT correspond to multiple small infarctions and moderate chronic microvascular ischemic changes of the brain. 4. Moderate brain parenchymal volume loss. These results were called by telephone at the time of interpretation on 11/03/2016 at 8:39 pm to Dr. Nanda Quinton , who verbally acknowledged these results. Electronically Signed   By: Kristine Garbe M.D.   On: 11/03/2016 20:41    EKG: Independently reviewed. Sinus Rhythm at 50bpm  Assessment/Plan Active Problems:   Arthritis   Parkinsonism (H[facility])   Gait instability   Hypothyroidism   Hyperlipidemia   Has a tremor   Breast CA ([place])   Essential (primary) hypertension   Memory deficit   CVA (cerebral vascular accident) ([place])   UTI (urinary tract infection)  1. Acute CVA; 74mm the right superior cerebellar peduncle/cerebellar junction -admit to tele -stroke protocol initiated. Neuro consulted in Ed, Dr Merlene Laughter, who suggested patient can be managed here and doesn't need higher level of care -rectal ASA, permissive HTN -MRI brain done, will order carotid US, 2D echo  -check tsh, lipid panel and A1c -not on statin at home  -neurochecks  -BP  in acceptable range at this time, hold off on treating it -npo for now, gentle hydration if needed  -accuchecks while NPO -PT.OT when she is able to participate.   2. UTI, could be contributing to her mental state as well.  -will order urine cultures, follow up -meanwhile start her on Rocephin IV  3. Hypothyroidism -will convert her synthroid to IV -follow up tsh   4. HTN, elevated  -hold off on treating it, due to permissive htn  -restart home meds when appropriate and her mentation is better   5. Parkinson's Dementia -cont home meds when she can take po   DVT prophylaxis: Hep SubQ Code Status: Full for now, but will need to clarify once able to get in touch with POA.  Family Communication: Attempted to call Dominga Ferry (daughter who is POA) but no response.  Disposition Plan: Tele for now.  Consults called: Dr Merlene Laughter from Neuro (call by the ED physician Dr Laverta Baltimore) Admission status: Inpatient tele    Ankit Arsenio Loader MD [facility]   If 7PM-7AM, please contact night-coverage [email or web address] Password [email or web address]  11/03/2016, 10:08 PM

## 2016-11-03 NOTE — ED Triage Notes (Addendum)
Patient brought in by EMS from [place] with complaint of generalized weakness and vomiting x 1 today. Denies pain at this time. States "I had a headache earlier but I don't know what day." Patient disoriented to time, place, and situation but has history of dementia.

## 2016-11-03 NOTE — ED Notes (Signed)
Spoke with Latoya at [employer]. States patient was fine at breakfast and lunch but upon returning patient from lunch, patient vomited x 1 and was incontinent of urine x 2. States she was "not able to keep her balance." Denies fall or injury.

## 2016-11-03 NOTE — ED Provider Notes (Signed)
Emergency Department Provider Note   I have reviewed the triage vital signs and the nursing notes.  Level 5 caveat: Dementia   HISTORY  Chief Complaint Weakness   HPI Katherine Roth is a 83 y.o. female with PMH of HLD, HTN, hypothyroid, dementia, and parkinson's disease presents to the emergency department for evaluation of sudden onset generalized weakness. Staff at the patient's nursing home state that she seemed fine until returning from lunch and she suddenly seemed generally weak and more confused than normal. Patient does have baseline dementia which limits my ability obtain a full HPI and ROS. No report by EMS regarding changes in medication. No new medication changes. No history or recent falls. The patient did have one episodes of non-bloody emesis prior to the weakness.    Past Medical History:  Diagnosis Date  . Arthritis   . Asthma   . Breast cancer ([place])   . Dementia   . Depression   . Dizziness   . Hallucination   . Hyperlipidemia   . Hypertension   . Hypothyroidism   . Macular degeneration     Patient Active Problem List   Diagnosis Date Noted  . CVA (cerebral vascular accident) ([place]) 11/03/2016  . UTI (urinary tract infection) 11/03/2016  . Memory deficit 07/24/2016  . Hypokalemia 01/24/2016  . Metabolic syndrome [id number]  . Has a tremor 03/25/2014  . Breast CA ([place]) 03/25/2014  . Essential (primary) hypertension 03/25/2014  . Hypothyroidism 01/31/2014  . Breast mass, right 01/31/2014  . Hyperlipidemia 01/31/2014  . Parkinsonism ([place]) 09/25/2013  . Gait instability 09/25/2013  . Hallucinations 09/25/2013  . Arthritis 07/22/2013  . H/O: CVA (cerebrovascular accident) 07/22/2013    Past Surgical History:  Procedure Laterality Date  . ABDOMINAL HYSTERECTOMY    . APPENDECTOMY    . BREAST SURGERY    . LUMBAR DISC SURGERY    . MASTECTOMY Right 04/2014      Allergies Patient has no known allergies.  Family History  Problem Relation  Age of Onset  . Heart disease Mother     CHF  . Parkinson's disease Mother   . Osteoporosis Mother   . Heart disease Father     CABG, died age 85  . Hypertension Father   . Heart disease Paternal Grandmother     Social History Social History  Substance Use Topics  . Smoking status: Never Smoker  . Smokeless tobacco: Never Used  . Alcohol use No    Review of Systems  Level 5 caveat: Dementia   ____________________________________________   PHYSICAL EXAM:  VITAL SIGNS: ED Triage Vitals  Enc Vitals Group     BP 11/03/16 1516 (!) 202/91     Pulse Rate 11/03/16 1516 (!) 54     Resp 11/03/16 1516 12     Temp 11/03/16 1516 98 F (36.7 C)     Temp Source 11/03/16 1516 Oral     SpO2 11/03/16 1516 100 %     Weight 11/03/16 1513 146 lb (66.2 kg)     Height 11/03/16 1513 5\' 3"  (1.6 m)     Pain Score 11/03/16 1514 0   Constitutional: Alert with notable confusion. Hard of hearing.  Eyes: Conjunctivae are normal. PERRL. EOMI. Head: Atraumatic. Nose: No congestion/rhinnorhea. Mouth/Throat: Mucous membranes are moist.  Oropharynx non-erythematous. Neck: No stridor.   Cardiovascular: Normal rate, regular rhythm. Good peripheral circulation. Grossly normal heart sounds.   Respiratory: Normal respiratory effort.  No retractions. Lungs CTAB. Gastrointestinal: Soft and nontender. No  distention.  Musculoskeletal: No lower extremity tenderness nor edema. No gross deformities of extremities. Neurologic:  Stuttering speech. Intention tremor noted. No gross focal neurologic deficits are appreciated.  Skin:  Skin is warm, dry and intact. No rash noted.  ____________________________________________   LABS (all labs ordered are listed, but only abnormal results are displayed)  Labs Reviewed  COMPREHENSIVE METABOLIC PANEL - Abnormal; Notable for the following:       Result Value   Potassium 3.4 (*)    Chloride 94 (*)    CO2 35 (*)    Glucose, Bld 122 (*)    Creatinine, Ser 1.11  (*)    Total Protein 8.2 (*)    ALT 7 (*)    GFR calc non Af Amer 45 (*)    GFR calc Af Amer 53 (*)    All other components within normal limits  CBC WITH DIFFERENTIAL/PLATELET - Abnormal; Notable for the following:    WBC 13.2 (*)    RBC 5.29 (*)    Hemoglobin 16.1 (*)    Neutro Abs 8.0 (*)    Monocytes Absolute 1.4 (*)    All other components within normal limits  URINALYSIS, ROUTINE W REFLEX MICROSCOPIC - Abnormal; Notable for the following:    Color, Urine STRAW (*)    APPearance HAZY (*)    Hgb urine dipstick SMALL (*)    Leukocytes, UA MODERATE (*)    Bacteria, UA RARE (*)    All other components within normal limits  TSH - Abnormal; Notable for the following:    TSH 0.027 (*)    All other components within normal limits  BASIC METABOLIC PANEL - Abnormal; Notable for the following:    Potassium 2.7 (*)    Chloride 94 (*)    Creatinine, Ser 1.15 (*)    GFR calc non Af Amer 43 (*)    GFR calc Af Amer 50 (*)    All other components within normal limits  CBC - Abnormal; Notable for the following:    RBC 5.14 (*)    Hemoglobin 15.3 (*)    All other components within normal limits  LIPID PANEL - Abnormal; Notable for the following:    Triglycerides 217 (*)    HDL 32 (*)    VLDL 43 (*)    LDL Cholesterol 107 (*)    All other components within normal limits  URINE CULTURE  MRSA PCR SCREENING  LIPASE, BLOOD  TROPONIN I  HEMOGLOBIN A1C   ____________________________________________  EKG   EKG Interpretation  Date/Time:  Friday November 03 2016 15:12:47 EST Ventricular Rate:  50 PR Interval:    QRS Duration: 80 QT Interval:  548 QTC Calculation: 500 R Axis:   -23 Text Interpretation:  Sinus rhythm.  Ventricular premature complex Anterior infarct, old PVC noted. No STEMI. Similar to prior.  Confirmed by Jaretssi Kraker MD, Oaklan Persons [phone]) on 11/03/2016 3:23:03 PM       ____________________________________________  RADIOLOGY  Dg Chest 2 View  Result Date:  11/03/2016 CLINICAL DATA:  Weakness, vomiting EXAM: CHEST  2 VIEW COMPARISON:  01/30/2014 FINDINGS: Mild patchy left lower lobe opacity, possibly reflecting chronic scarring when correlating with 2015, although superimposed pneumonia is not excluded. No pleural effusion or pneumothorax. Cardiomegaly. Visualized osseous structures are within normal limits. IMPRESSION: Mild patchy left lower lobe opacity, possibly chronic scarring, superimposed pneumonia not excluded. Electronically Signed   By: Julian Hy M.D.   On: 11/03/2016 16:59   Ct Head Wo Contrast  Result Date:  11/03/2016 CLINICAL DATA:  Altered mental status and vomiting. History of breast cancer. EXAM: CT HEAD WITHOUT CONTRAST TECHNIQUE: Contiguous axial images were obtained from the base of the skull through the vertex without intravenous contrast. COMPARISON:  03/28/2010 FINDINGS: Brain: No evidence of hemorrhage, hydrocephalus, extra-axial collection or mass effect. There is moderate brain parenchymal volume loss. Areas of hypoattenuation is seen in the subcortical white matter, most prominent in the left frontal lobe fold by a right frontal and right parietal lobes. Vascular: Calcific atherosclerotic disease at the skullbase. Skull: Normal. Negative for fracture or focal lesion. Sinuses/Orbits: No acute finding. Other: None. IMPRESSION: Areas of hypoattenuation in the subcortical white matter in left frontal, right frontal and right parietal lobes. These may represent areas of microangiopathy, however age-indeterminate, potentially watershed infarcts, or vasogenic edema cannot be excluded (particularly given patient's history of breast cancer). Further evaluation with brain MRI with contrast may be considered, if found clinically indicated. Electronically Signed   By: Fidela Salisbury M.D.   On: 11/03/2016 17:24   Mr Jeri Cos [id number] Contrast  Result Date: 11/03/2016 CLINICAL DATA:  83 y/o F; history of breast cancer with abnormal CT of the  head. EXAM: MRI HEAD WITHOUT AND WITH CONTRAST TECHNIQUE: Multiplanar, multiecho pulse sequences of the brain and surrounding structures were obtained without and with intravenous contrast. CONTRAST:  51mL MULTIHANCE GADOBENATE DIMEGLUMINE 529 MG/ML IV SOLN COMPARISON:  11/03/2016 CT head. FINDINGS: Brain: 6 mm focus of diffusion restriction within the right superior cerebellar peduncle/cerebellar junction (series 5, image 53) without enhanced. Small chronic infarct in the left superior frontal lobe. Scattered foci of T2 FLAIR hyperintensity within the pons, right caudate head, and bilateral lentiform nuclei greater on the left are compatible with chronic lacunar infarcts. Extensive prominent perivascular spaces of the basal ganglia. Background of moderate chronic microvascular ischemic changes and parenchymal volume loss of the brain. No evidence of intracranial hemorrhage, focal mass effect, extra-axial collection. No hydrocephalus. No abnormal enhancement of the brain. Vascular: Normal flow voids. Skull and upper cervical spine: Normal marrow signal. Sinuses/Orbits: Negative. Other: None. IMPRESSION: 1. 6 mm acute/early subacute infarct within the right superior cerebellar peduncle/cerebellar junction. No acute intracranial hemorrhage. 2. No evidence of intracranial metastatic disease. 3. Foci of hypoattenuation on CT correspond to multiple small infarctions and moderate chronic microvascular ischemic changes of the brain. 4. Moderate brain parenchymal volume loss. These results were called by telephone at the time of interpretation on 11/03/2016 at 8:39 pm to Dr. Nanda Quinton , who verbally acknowledged these results. Electronically Signed   By: Kristine Garbe M.D.   On: 11/03/2016 20:41    ____________________________________________   PROCEDURES  Procedure(s) performed:   Procedures  None ____________________________________________   INITIAL IMPRESSION / ASSESSMENT AND PLAN / ED  COURSE  Pertinent labs & imaging results that were available during my care of the patient were reviewed by me and considered in my medical decision making (see chart for details).  Patient resents to the emergency department for evaluation of generalized weakness that by report began suddenly. The patient has a past medical history of dementia and Parkinson's disease. She denies any pain at this time. She does have significantly elevated blood pressure. No evidence of head trauma. Patient has EKG with bradycardia that seems consistent with prior. No focal findings to suggest obvious acute stroke. With elevated blood pressure could represent hypertensive event but will evaluate for underlying infection and obtain head CT.   05:51 PM Patient CT scan reviewed. Questionable infarct versus new-onset  vasogenic edema. Patient with no focal deficits but difficult to assess with her underlying dementia. Attempted to contact the patient's daughter, Moreen Fowler, without success.   08:50 PM Spoke with Radiology regarding the MRI results. Will discuss with Neurology.   09:30 PM Spoke with Dr. Raynelle Highland with Neurology. Recommends admission for ECHO and CVA w/u. Will increase the patient's ASA to full dose and allow for permissive HTN at this time. Paged hospitalist for admission.   Discussed patient's case with hospitalist, Dr. Reesa Chew.  Recommend admission to inpatient, telemetry bed.  I will place holding orders per their request. Patient and family (if present) updated with plan. Care transferred to hospitalist service.  I reviewed all nursing notes, vitals, pertinent old records, EKGs, labs, imaging (as available).  ____________________________________________  FINAL CLINICAL IMPRESSION(S) / ED DIAGNOSES  Final diagnoses:  Cerebrovascular accident (CVA), unspecified mechanism ([place])     MEDICATIONS GIVEN DURING THIS VISIT:  Medications  donepezil (ARICEPT) tablet 5 mg (not administered)  aspirin  EC tablet 81 mg (not administered)  FLUoxetine (PROZAC) capsule 20 mg (not administered)  risperiDONE (RISPERDAL) tablet 0.25 mg (not administered)  carbidopa-levodopa (SINEMET IR) 25-100 MG per tablet immediate release 1 tablet (not administered)  QUEtiapine (SEROQUEL) tablet 25 mg (not administered)   stroke: mapping our early stages of recovery book (not administered)  acetaminophen (TYLENOL) tablet 650 mg (not administered)    Or  acetaminophen (TYLENOL) solution 650 mg (not administered)    Or  acetaminophen (TYLENOL) suppository 650 mg (not administered)  senna-docusate (Senokot-S) tablet 1 tablet (not administered)  heparin injection 5,000 Units (5,000 Units Subcutaneous Not Given 11/04/16 0600)  cefTRIAXone (ROCEPHIN) 1 g in dextrose 5 % 50 mL IVPB (1 g Intravenous New Bag/Given 11/03/16 2229)  levothyroxine (SYNTHROID, LEVOTHROID) injection 75 mcg (not administered)  gadobenate dimeglumine (MULTIHANCE) injection 13 mL (13 mLs Intravenous Contrast Given 11/03/16 2005)  aspirin suppository 300 mg (300 mg Rectal Given 11/03/16 2231)     NEW OUTPATIENT MEDICATIONS STARTED DURING THIS VISIT:  None   Note:  This document was prepared using Dragon voice recognition software and may include unintentional dictation errors.  Nanda Quinton, MD Emergency Medicine  Margette Fast, MD 11/04/16 [phone]

## 2016-11-04 ENCOUNTER — Inpatient Hospital Stay (HOSPITAL_COMMUNITY): Payer: Medicare Other

## 2016-11-04 DIAGNOSIS — I6789 Other cerebrovascular disease: Secondary | ICD-10-CM

## 2016-11-04 DIAGNOSIS — I63012 Cerebral infarction due to thrombosis of left vertebral artery: Secondary | ICD-10-CM

## 2016-11-04 LAB — BASIC METABOLIC PANEL
Anion gap: 10 (ref 5–15)
BUN: 17 mg/dL (ref 6–20)
CO2: 32 mmol/L (ref 22–32)
Calcium: 9.1 mg/dL (ref 8.9–10.3)
Chloride: 94 mmol/L — ABNORMAL LOW (ref 101–111)
Creatinine, Ser: 1.15 mg/dL — ABNORMAL HIGH (ref 0.44–1.00)
GFR calc Af Amer: 50 mL/min — ABNORMAL LOW (ref >60)
GFR calc non Af Amer: 43 mL/min — ABNORMAL LOW (ref >60)
Glucose, Bld: 95 mg/dL (ref 65–99)
POTASSIUM: 2.7 mmol/L — AB (ref 3.5–5.1)
SODIUM: 136 mmol/L (ref 135–145)

## 2016-11-04 LAB — LIPID PANEL
CHOL/HDL RATIO: 5.7 ratio
Cholesterol: 182 mg/dL (ref 0–200)
HDL: 32 mg/dL — AB (ref >40)
LDL CALC: 107 mg/dL — AB (ref 0–99)
Triglycerides: 217 mg/dL — ABNORMAL HIGH (ref <150)
VLDL: 43 mg/dL — ABNORMAL HIGH (ref 0–40)

## 2016-11-04 LAB — CBC
HEMATOCRIT: 43.8 % (ref 36.0–46.0)
Hemoglobin: 15.3 g/dL — ABNORMAL HIGH (ref 12.0–15.0)
MCH: 29.8 pg (ref 26.0–34.0)
MCHC: 34.9 g/dL (ref 30.0–36.0)
MCV: 85.2 fL (ref 78.0–100.0)
Platelets: 302 10*3/uL (ref 150–400)
RBC: 5.14 MIL/uL — AB (ref 3.87–5.11)
RDW: 12.4 % (ref 11.5–15.5)
WBC: 9.9 10*3/uL (ref 4.0–10.5)

## 2016-11-04 LAB — ECHOCARDIOGRAM COMPLETE
Height: 63 in
WEIGHTICAEL: 2227.2 [oz_av]

## 2016-11-04 LAB — MAGNESIUM: Magnesium: 1.8 mg/dL (ref 1.7–2.4)

## 2016-11-04 LAB — TSH: TSH: 0.027 u[IU]/mL — ABNORMAL LOW (ref 0.350–4.500)

## 2016-11-04 LAB — MRSA PCR SCREENING: MRSA by PCR: NEGATIVE

## 2016-11-04 MED ORDER — POTASSIUM CHLORIDE 20 MEQ/15ML (10%) PO SOLN
40.0000 meq | ORAL | Status: AC
  Administered 2016-11-04 – 2016-11-05 (×3): 40 meq via ORAL
  Filled 2016-11-04 (×3): qty 30

## 2016-11-04 MED ORDER — CARBIDOPA-LEVODOPA 25-100 MG PO TABS
ORAL_TABLET | ORAL | Status: AC
  Filled 2016-11-04: qty 1

## 2016-11-04 NOTE — Progress Notes (Signed)
Patient awakened from sleep this morning to do neuro assessment.  Patient opens eyes to voice and touch but does not attempt to speak or follow commands at this time.  Moving feet and legs independently in bed without prompting.  Pupils remain slightly sluggish. Patient resting well, in NAD. Will reassess later this AM.

## 2016-11-04 NOTE — Evaluation (Addendum)
Clinical/Bedside Swallow Evaluation Patient Details  Name: Katherine Roth MRN: [id number] Date of Birth: 02/16/1933  Today's Date: 11/04/2016 Time: SLP Start Time (ACUTE ONLY): 1440 SLP Stop Time (ACUTE ONLY): 1501 SLP Time Calculation (min) (ACUTE ONLY): 21 min  Past Medical History:  Past Medical History:  Diagnosis Date  . Arthritis   . Asthma   . Breast cancer ([place])   . Dementia   . Depression   . Dizziness   . Hallucination   . Hyperlipidemia   . Hypertension   . Hypothyroidism   . Macular degeneration    Past Surgical History:  Past Surgical History:  Procedure Laterality Date  . ABDOMINAL HYSTERECTOMY    . APPENDECTOMY    . BREAST SURGERY    . LUMBAR DISC SURGERY    . MASTECTOMY Right 04/2014   HPI:  Pt is an 83 y/o woman admitted from ALF due to confusin and lethargy who was found to have an acute CVA. Medical history significant of HTN, HLD, Parkinson's dementia, hypothyroidism was sent to the ED from her nursing home ([facility]) for evaluation of episode of non bloody vomiting and confusion. Baseline mentaql status is unknown at this time. UA showed mild UTI with mild leukocytosis on her CBC. MRI reveals 6 mm acute/early subacute infarct within the right superior cerebellar peduncle/cerebellar junction. Pt failed stroke swallow and ST will complete clinical swallow evaluation and treatment as indicated.   Assessment / Plan / Recommendation Clinical Impression  Clinical swallow evaluation completed at bedside; pt was easily roused from sleeping and was pleasantly confused reporting she lives in [place] with her husband (Pt was admitted from ALF, [facility] in [place]). Pt has Parkinson's Dementia although cognitive baseline is unknown at this time. Oral mechanism exam was unremarkable. Pt consumed regular textures, puree textures and thin liquids demonstrating no overt s/sx of aspiration with any textures/consistencies presented. Recommend Regular  textures and thin liquids. Meds to be administered whole with liquids. Pt does present with some difficulty self feeding secondary to tremors and will benefit from feeder assist.  There are no further swallowing needs noted although ST will evaluate cognition to assess needs and/or potential change from baseline.     Aspiration Risk  Mild aspiration risk    Diet Recommendation Regular   Liquid Administration via: Cup;Straw Medication Administration: Whole meds with liquid Supervision: Staff to assist with self feeding Compensations: Minimize environmental distractions;Slow rate;Small sips/bites Postural Changes: Seated upright at 90 degrees    Other  Recommendations Oral Care Recommendations: Oral care BID     Swallow Study   General Date of Onset: 11/04/16 HPI: Pt is an 83 y/o woman admitted from ALF due to cnofusin and lethargy who was found to have an acute CVA. Medical history significant of HTN, HLD, Parkinson's dementia, hypothyroidism was sent to the ED from her nursing home ([facility]) for evaluation of episode of non bloody vomiting and confusion. Baseline mentaql status is unknown at this time. UA showed mild UTI with mild leukocytosis on her CBC. MRI reveals 6 mm acute/early subacute infarct within the right superior cerebellar peduncle/cerebellar junction. Pt failed stroke swallow and ST will complete clinical swallow evaluation and treatment as indicated. Type of Study: Bedside Swallow Evaluation Previous Swallow Assessment: none Diet Prior to this Study: NPO Temperature Spikes Noted: No Respiratory Status: Room air History of Recent Intubation: No Behavior/Cognition: Alert;Cooperative;Pleasant mood;Confused Oral Cavity Assessment: Within Functional Limits Oral Care Completed by SLP: Recent completion by staff Oral  Cavity - Dentition: Adequate natural dentition Vision: Functional for self-feeding Self-Feeding Abilities: Needs assist Patient Positioning:  Upright in bed Baseline Vocal Quality: Normal Volitional Cough: Strong Volitional Swallow: Able to elicit    Oral/Motor/Sensory Function Overall Oral Motor/Sensory Function: Within functional limits   Ice Chips Ice chips: Within functional limits   Thin Liquid Thin Liquid: Within functional limits    Nectar Thick Nectar Thick Liquid: Not tested   Honey Thick Honey Thick Liquid: Not tested   Puree Puree: Within functional limits   Solid   Katherine Roth, CCC-SLP Speech Language Pathologist    Solid: Within functional limits        [name] 11/04/2016,3:16 PM

## 2016-11-04 NOTE — Progress Notes (Signed)
PT Cancellation Note  Patient Details Name: Katherine Roth MRN: [id number] DOB: [birth year]   Cancelled Treatment:     Attempted to evaluate pt per MD request. Pt receiving echogram at the time and requested PT come back later.  10:33 AM,11/04/16 Elly Modena PT, DPT [facility] Outpatient Physical Therapy [phone]    Elly Modena 11/04/2016, 10:33 AM

## 2016-11-04 NOTE — Progress Notes (Signed)
No PO meds given due to not passing swallow screen.  HR and BP slighlty abnormal.  MD notified.

## 2016-11-04 NOTE — Progress Notes (Signed)
PT Cancellation Note  Patient Details Name: Katherine Roth MRN: [id number] DOB: 12-08-1932   Cancelled Treatment:     Failed attempt x2 to see pt. Ultrasound just arrived for a carotid doppler and requested PT try again later. Will attempt to evaluate pt tomorrow AM.   10:35 AM,11/04/16 Elly Modena PT, DPT [facility] Outpatient Physical Therapy [phone]    Elly Modena 11/04/2016, 10:34 AM

## 2016-11-04 NOTE — Progress Notes (Signed)
PROGRESS NOTE    Katherine Roth  [id number] DOB: February 04, 1933 DOA: 11/03/2016 PCP: Redge Gainer, MD     Brief Narrative:  83 year old woman admitted from ALF on 1/26 due to confusion and lethargy and found to have an acute CVA. Admitted for further evaluation and management.   Assessment & Plan:   Active Problems:   Arthritis   Parkinsonism ([place])   Gait instability   Hypothyroidism   Hyperlipidemia   Has a tremor   Breast CA ([place])   Essential (primary) hypertension   Memory deficit   CVA (cerebral vascular accident) ([place])   UTI (urinary tract infection)   Acute CVA -MRI shows a 6 mm acute infarct within the right superior cerebellar peduncle with no acute intracranial hemorrhage. -Echo/Doppler's done with results pending. -PT/OT/ST consultations pending. -Continue aspirin suppository for secondary stroke prevention for now, if necessary evaluation consider upgrading to Plavix as she has been on aspirin at home. -No arrhythmias noted on telemetry.  Hypothyroidism -Continue IV Synthroid for now  Parkinson's dementia -Continue home medications once able to take by mouth medications  Hypertension -Allow permissive hypertension in the face of acute CVA. Systolic blood pressure around 160.  Questionable UTI -Do not believe this is accurate based on results of urinalysis. -We'll discontinue Rocephin.   DVT prophylaxis: Subcutaneous heparin Code Status: Full code Family Communication: Patient only Disposition Plan: To be determined pending therapy consultations  Consultants:   None  Procedures:   2-D echo pending  Carotid Dopplers pending  Antimicrobials:  Anti-infectives    Start     Dose/Rate Route Frequency Ordered Stop   11/03/16 2230  cefTRIAXone (ROCEPHIN) 1 g in dextrose 5 % 50 mL IVPB     1 g 100 mL/hr over 30 Minutes Intravenous Every 24 hours 11/03/16 2213         Subjective: Seems a little confused  Objective: Vitals:   11/04/16  0513 11/04/16 0713 11/04/16 0915 11/04/16 1113  BP: (!) 144/117 (!) 162/52 (!) 181/60 (!) 115/59  Pulse: (!) 53 (!) 51 (!) 53 (!) 57  Resp: 18 18 16 16   Temp: 98.3 F (36.8 C) 98.4 F (36.9 C) 98.9 F (37.2 C) 98.2 F (36.8 C)  TempSrc: Oral Axillary Oral Oral  SpO2: 96% 95% 98% 100%  Weight:      Height:        Intake/Output Summary (Last 24 hours) at 11/04/16 1357 Last data filed at 11/04/16 0900  Gross per 24 hour  Intake                0 ml  Output                0 ml  Net                0 ml   Filed Weights   11/03/16 1513 11/03/16 2313  Weight: 66.2 kg (146 lb) 63.1 kg (139 lb 3.2 oz)    Examination:  General exam: Awake, confused, very poor dentition with halitosis Respiratory system: Clear to auscultation. Respiratory effort normal. Cardiovascular system:RRR. No murmurs, rubs, gallops. Gastrointestinal system: Abdomen is nondistended, soft and nontender. No organomegaly or masses felt. Normal bowel sounds heard. Extremities: No C/C/E, +pedal pulses Skin: No rashes, lesions or ulcers Psychiatry:  unable to fully assess given current mental state   Data Reviewed: I have personally reviewed following labs and imaging studies  CBC:  Recent Labs Lab 11/03/16 1546 11/04/16 0721  WBC 13.2* 9.9  NEUTROABS 8.0*  --  HGB 16.1* 15.3*  HCT 45.6 43.8  MCV 86.2 85.2  PLT 317 [id number]   Basic Metabolic Panel:  Recent Labs Lab 11/03/16 1546 11/04/16 0721  NA 138 136  K 3.4* 2.7*  CL 94* 94*  CO2 35* 32  GLUCOSE 122* 95  BUN 12 17  CREATININE 1.11* 1.15*  CALCIUM 9.8 9.1   GFR: Estimated Creatinine Clearance: 34.3 mL/min (by C-G formula based on SCr of 1.15 mg/dL (H)). Liver Function Tests:  Recent Labs Lab 11/03/16 1546  AST 17  ALT 7*  ALKPHOS 89  BILITOT 1.1  PROT 8.2*  ALBUMIN 3.6    Recent Labs Lab 11/03/16 1546  LIPASE 27   No results for input(s): AMMONIA in the last 168 hours. Coagulation Profile: No results for input(s): INR,  PROTIME in the last 168 hours. Cardiac Enzymes:  Recent Labs Lab 11/03/16 1546  TROPONINI <0.03   BNP (last 3 results) No results for input(s): PROBNP in the last 8760 hours. HbA1C: No results for input(s): HGBA1C in the last 72 hours. CBG: No results for input(s): GLUCAP in the last 168 hours. Lipid Profile:  Recent Labs  11/04/16 0721  CHOL 182  HDL 32*  LDLCALC 107*  TRIG 217*  CHOLHDL 5.7   Thyroid Function Tests:  Recent Labs  11/04/16 0721  TSH 0.027*   Anemia Panel: No results for input(s): VITAMINB12, FOLATE, FERRITIN, TIBC, IRON, RETICCTPCT in the last 72 hours. Urine analysis:    Component Value Date/Time   COLORURINE STRAW (A) 11/03/2016 1540   APPEARANCEUR HAZY (A) 11/03/2016 1540   LABSPEC 1.006 11/03/2016 1540   PHURINE 5.0 11/03/2016 1540   GLUCOSEU NEGATIVE 11/03/2016 1540   HGBUR SMALL (A) 11/03/2016 1540   BILIRUBINUR NEGATIVE 11/03/2016 1540   BILIRUBINUR neg 10/19/2014 1635   KETONESUR NEGATIVE 11/03/2016 1540   PROTEINUR NEGATIVE 11/03/2016 1540   UROBILINOGEN negative 10/19/2014 1635   UROBILINOGEN 0.2 03/28/2010 0530   NITRITE NEGATIVE 11/03/2016 1540   LEUKOCYTESUR MODERATE (A) 11/03/2016 1540   Sepsis Labs: @LABRCNTIP (procalcitonin:4,lacticidven:4)  ) Recent Results (from the past 240 hour(s))  MRSA PCR Screening     Status: None   Collection Time: 11/03/16 11:46 PM  Result Value Ref Range Status   MRSA by PCR NEGATIVE NEGATIVE Final    Comment:        The GeneXpert MRSA Assay (FDA approved for NASAL specimens only), is one component of a comprehensive MRSA colonization surveillance program. It is not intended to diagnose MRSA infection nor to guide or monitor treatment for MRSA infections.          Radiology Studies: Dg Chest 2 View  Result Date: 11/03/2016 CLINICAL DATA:  Weakness, vomiting EXAM: CHEST  2 VIEW COMPARISON:  01/30/2014 FINDINGS: Mild patchy left lower lobe opacity, possibly reflecting chronic  scarring when correlating with 2015, although superimposed pneumonia is not excluded. No pleural effusion or pneumothorax. Cardiomegaly. Visualized osseous structures are within normal limits. IMPRESSION: Mild patchy left lower lobe opacity, possibly chronic scarring, superimposed pneumonia not excluded. Electronically Signed   By: Julian Hy M.D.   On: 11/03/2016 16:59   Ct Head Wo Contrast  Result Date: 11/03/2016 CLINICAL DATA:  Altered mental status and vomiting. History of breast cancer. EXAM: CT HEAD WITHOUT CONTRAST TECHNIQUE: Contiguous axial images were obtained from the base of the skull through the vertex without intravenous contrast. COMPARISON:  03/28/2010 FINDINGS: Brain: No evidence of hemorrhage, hydrocephalus, extra-axial collection or mass effect. There is moderate brain parenchymal volume loss. Areas of  hypoattenuation is seen in the subcortical white matter, most prominent in the left frontal lobe fold by a right frontal and right parietal lobes. Vascular: Calcific atherosclerotic disease at the skullbase. Skull: Normal. Negative for fracture or focal lesion. Sinuses/Orbits: No acute finding. Other: None. IMPRESSION: Areas of hypoattenuation in the subcortical white matter in left frontal, right frontal and right parietal lobes. These may represent areas of microangiopathy, however age-indeterminate, potentially watershed infarcts, or vasogenic edema cannot be excluded (particularly given patient's history of breast cancer). Further evaluation with brain MRI with contrast may be considered, if found clinically indicated. Electronically Signed   By: Fidela Salisbury M.D.   On: 11/03/2016 17:24   Mr Jeri Cos [id number] Contrast  Result Date: 11/03/2016 CLINICAL DATA:  83 y/o F; history of breast cancer with abnormal CT of the head. EXAM: MRI HEAD WITHOUT AND WITH CONTRAST TECHNIQUE: Multiplanar, multiecho pulse sequences of the brain and surrounding structures were obtained without and  with intravenous contrast. CONTRAST:  38mL MULTIHANCE GADOBENATE DIMEGLUMINE 529 MG/ML IV SOLN COMPARISON:  11/03/2016 CT head. FINDINGS: Brain: 6 mm focus of diffusion restriction within the right superior cerebellar peduncle/cerebellar junction (series 5, image 53) without enhanced. Small chronic infarct in the left superior frontal lobe. Scattered foci of T2 FLAIR hyperintensity within the pons, right caudate head, and bilateral lentiform nuclei greater on the left are compatible with chronic lacunar infarcts. Extensive prominent perivascular spaces of the basal ganglia. Background of moderate chronic microvascular ischemic changes and parenchymal volume loss of the brain. No evidence of intracranial hemorrhage, focal mass effect, extra-axial collection. No hydrocephalus. No abnormal enhancement of the brain. Vascular: Normal flow voids. Skull and upper cervical spine: Normal marrow signal. Sinuses/Orbits: Negative. Other: None. IMPRESSION: 1. 6 mm acute/early subacute infarct within the right superior cerebellar peduncle/cerebellar junction. No acute intracranial hemorrhage. 2. No evidence of intracranial metastatic disease. 3. Foci of hypoattenuation on CT correspond to multiple small infarctions and moderate chronic microvascular ischemic changes of the brain. 4. Moderate brain parenchymal volume loss. These results were called by telephone at the time of interpretation on 11/03/2016 at 8:39 pm to Dr. Nanda Quinton , who verbally acknowledged these results. Electronically Signed   By: Kristine Garbe M.D.   On: 11/03/2016 20:41   US Carotid Bilateral (at [facility] Only)  Result Date: 11/04/2016 CLINICAL DATA:  Altered mental status.  Parkinson's. EXAM: BILATERAL CAROTID DUPLEX ULTRASOUND TECHNIQUE: Pearline Cables scale imaging, color Doppler and duplex ultrasound were performed of bilateral carotid and vertebral arteries in the neck. COMPARISON:  None. FINDINGS: Criteria: Quantification of carotid stenosis  is based on velocity parameters that correlate the residual internal carotid diameter with NASCET-based stenosis levels, using the diameter of the distal internal carotid lumen as the denominator for stenosis measurement. The following velocity measurements were obtained: RIGHT ICA:  55 cm/sec CCA:  62 cm/sec SYSTOLIC ICA/CCA RATIO:  0.9 DIASTOLIC ICA/CCA RATIO:  1.6 ECA:  82 cm/sec LEFT ICA:  150 cm/sec CCA:  54 cm/sec SYSTOLIC ICA/CCA RATIO:  2.6 DIASTOLIC ICA/CCA RATIO:  3.2 ECA:  92 cm/sec RIGHT CAROTID ARTERY: Small amount of echogenic plaque in the right common carotid artery without significant stenosis. Small amount of circumferential plaque at the right carotid bulb. Echogenic plaque at the origin of the external carotid artery. External carotid artery is patent with normal waveform. Small amount of plaque in the proximal internal carotid artery. Normal waveforms and velocities in the internal carotid artery. RIGHT VERTEBRAL ARTERY:  Not visualized. LEFT CAROTID ARTERY: Small  amount of plaque throughout the left common carotid artery. Moderate amount of plaque at the left carotid bulb. External carotid artery is patent with normal waveform. Irregular plaque in the proximal internal carotid artery. Left internal carotid artery is poorly visualized and reportedly this is related to patient's mental status and ability to follow commands. Peak systolic velocity in the distal left internal carotid artery is elevated measuring up to 150 cm/sec. LEFT VERTEBRAL ARTERY:  Not visualized IMPRESSION: Technically challenging examination. Bilateral vertebral arteries were not identified. Atherosclerotic disease in the bilateral carotid arteries. Estimated degree of stenosis in the right internal carotid artery is less than 50%. Estimated degree of stenosis in left internal carotid artery is between 50-69% but evaluation of the left internal carotid artery is markedly limited on this examination. The carotid and vertebral  arteries may be better characterized with a MRA or CTA of the neck. Electronically Signed   By: Markus Daft M.D.   On: 11/04/2016 11:43        Scheduled Meds: .  stroke: mapping our early stages of recovery book   Does not apply Once  . aspirin EC  81 mg Oral Daily  . carbidopa-levodopa  1 tablet Oral TID  . cefTRIAXone (ROCEPHIN)  IV  1 g Intravenous Q24H  . donepezil  5 mg Oral QHS  . FLUoxetine  20 mg Oral Daily  . heparin  5,000 Units Subcutaneous Q8H  . levothyroxine  75 mcg Intravenous Daily  . QUEtiapine  25 mg Oral QHS  . risperiDONE  0.25 mg Oral QHS   Continuous Infusions:   LOS: 1 day    Time spent: 25 minutes. Greater than 50% of this time was spent in direct contact with the patient coordinating care.     Lelon Frohlich, MD [facility] Pager [phone]  If 7PM-7AM, please contact night-coverage [email or web address] Password [email or web address] 11/04/2016, 1:57 PM

## 2016-11-04 NOTE — Plan of Care (Signed)
Problem: Education: Goal: Knowledge of disease or condition will improve Outcome: Not Progressing Patient has dementia - does not understand that she has had a stroke.  No family at bedside

## 2016-11-04 NOTE — Progress Notes (Signed)
Attempted NIHSS but unable to assess accurately  Patient has dementia and is confused.  Not following all commands appropriately or answering appropriately.  L arm strength is moderate, R arm is weak.  Patient does not seem to understand request when testing for drift and ataxia. Patient more alert though at this time. Confused. Tech in room to obtain ECHO.

## 2016-11-04 NOTE — Progress Notes (Signed)
Pt did not pass Stroke Swallow Test.  No meds given by mouth.  Dr informed.  Responded : No IV alternatives available.  AM Speech consult will handle.

## 2016-11-04 NOTE — Progress Notes (Signed)
CRITICAL VALUE ALERT  Critical value received:  Potassium 2.7    Date of notification:  11/04/16  Time of notification:  0920  Critical value read back:Yes.    Nurse who received alert:  Ebony Hail  MD notified (1st page):  Jerilee Hoh  Time of first page:  0920am  MD notified (2nd page):  Time of second page:  Responding MD:    Time MD responded:

## 2016-11-04 NOTE — Progress Notes (Signed)
  Echocardiogram 2D Echocardiogram has been performed.  Denine Brotz L Androw 11/04/2016, 10:34 AM

## 2016-11-05 DIAGNOSIS — I6302 Cerebral infarction due to thrombosis of basilar artery: Secondary | ICD-10-CM

## 2016-11-05 LAB — HEMOGLOBIN A1C
Hgb A1c MFr Bld: 5.3 % (ref 4.8–5.6)
MEAN PLASMA GLUCOSE: 105 mg/dL

## 2016-11-05 MED ORDER — CLOPIDOGREL BISULFATE 75 MG PO TABS
75.0000 mg | ORAL_TABLET | Freq: Every day | ORAL | Status: DC
  Administered 2016-11-05 – 2016-11-06 (×2): 75 mg via ORAL
  Filled 2016-11-05 (×2): qty 1

## 2016-11-05 MED ORDER — ROSUVASTATIN CALCIUM 10 MG PO TABS
5.0000 mg | ORAL_TABLET | Freq: Every day | ORAL | Status: DC
  Administered 2016-11-05: 5 mg via ORAL
  Filled 2016-11-05: qty 1

## 2016-11-05 NOTE — Progress Notes (Addendum)
PROGRESS NOTE    MARK SCHAFFTER  [id number] DOB: 07-13-1933 DOA: 11/03/2016 PCP: Redge Gainer, MD     Brief Narrative:  83 year old woman admitted from ALF on 1/26 due to confusion and lethargy and found to have an acute CVA. Admitted for further evaluation and management.   Assessment & Plan:   Active Problems:   Arthritis   Parkinsonism ([place])   Gait instability   Hypothyroidism   Hyperlipidemia   Has a tremor   Breast CA ([place])   Essential (primary) hypertension   Memory deficit   CVA (cerebral vascular accident) ([place])   UTI (urinary tract infection)   Acute CVA -MRI shows a 6 mm acute infarct within the right superior cerebellar peduncle with no acute intracranial hemorrhage. -Echo: EF 55-60%, mild LVH, no WMA. Poor study. -Carotid Dopplers: Limited study; right ICA 50%, left ICA 50-69% stenosis. Vertebrals not assessed. -PT/OT consultations pending. -ST has recommended regular diet following swallowing evaluation. -Upgrade ASA to plavix for secondary stroke prevention. -LDL 107, start statin. -No arrhythmias noted on telemetry.  Hypothyroidism -Continue IV Synthroid for now  Parkinson's dementia -Continue home medications once able to take by mouth medications  Hypertension -Allow permissive hypertension in the face of acute CVA.   Questionable UTI -Do not believe this is accurate based on results of urinalysis. -We'll discontinue Rocephin.   DVT prophylaxis: Subcutaneous heparin Code Status: Full code Family Communication: Patient only Disposition Plan: To be determined pending therapy consultations  Consultants:   None  Procedures:   2-D echo pending  Carotid Dopplers pending  Antimicrobials:  Anti-infectives    Start     Dose/Rate Route Frequency Ordered Stop   11/03/16 2230  cefTRIAXone (ROCEPHIN) 1 g in dextrose 5 % 50 mL IVPB  Status:  Discontinued     1 g 100 mL/hr over 30 Minutes Intravenous Every 24 hours 11/03/16 2213  11/04/16 1401       Subjective: Seems a little confused  Objective: Vitals:   11/04/16 2313 11/05/16 0313 11/05/16 0713 11/05/16 1111  BP: (!) 123/58 (!) 130/53 (!) 96/49 (!) 119/55  Pulse: (!) 58 (!) 55 (!) 54 62  Resp: 18 18 18 18   Temp: 97.5 F (36.4 C) 97.4 F (36.3 C) 97.6 F (36.4 C) 98.7 F (37.1 C)  TempSrc: Oral Oral Oral Oral  SpO2: 99% 96% 98% 97%  Weight:      Height:        Intake/Output Summary (Last 24 hours) at 11/05/16 1145 Last data filed at 11/05/16 1052  Gross per 24 hour  Intake              980 ml  Output                1 ml  Net              979 ml   Filed Weights   11/03/16 1513 11/03/16 2313  Weight: 66.2 kg (146 lb) 63.1 kg (139 lb 3.2 oz)    Examination:  General exam: Awake, confused, very poor dentition with halitosis Respiratory system: Clear to auscultation. Respiratory effort normal. Cardiovascular system:RRR. No murmurs, rubs, gallops. Gastrointestinal system: Abdomen is nondistended, soft and nontender. No organomegaly or masses felt. Normal bowel sounds heard. Extremities: No C/C/E, +pedal pulses Skin: No rashes, lesions or ulcers Psychiatry:  unable to fully assess given current mental state   Data Reviewed: I have personally reviewed following labs and imaging studies  CBC:  Recent Labs Lab 11/03/16 1546  11/04/16 0721  WBC 13.2* 9.9  NEUTROABS 8.0*  --   HGB 16.1* 15.3*  HCT 45.6 43.8  MCV 86.2 85.2  PLT 317 [id number]   Basic Metabolic Panel:  Recent Labs Lab 11/03/16 1546 11/04/16 0721  NA 138 136  K 3.4* 2.7*  CL 94* 94*  CO2 35* 32  GLUCOSE 122* 95  BUN 12 17  CREATININE 1.11* 1.15*  CALCIUM 9.8 9.1  MG  --  1.8   GFR: Estimated Creatinine Clearance: 34.3 mL/min (by C-G formula based on SCr of 1.15 mg/dL (H)). Liver Function Tests:  Recent Labs Lab 11/03/16 1546  AST 17  ALT 7*  ALKPHOS 89  BILITOT 1.1  PROT 8.2*  ALBUMIN 3.6    Recent Labs Lab 11/03/16 1546  LIPASE 27   No results for  input(s): AMMONIA in the last 168 hours. Coagulation Profile: No results for input(s): INR, PROTIME in the last 168 hours. Cardiac Enzymes:  Recent Labs Lab 11/03/16 1546  TROPONINI <0.03   BNP (last 3 results) No results for input(s): PROBNP in the last 8760 hours. HbA1C:  Recent Labs  11/04/16 0721  HGBA1C 5.3   CBG: No results for input(s): GLUCAP in the last 168 hours. Lipid Profile:  Recent Labs  11/04/16 0721  CHOL 182  HDL 32*  LDLCALC 107*  TRIG 217*  CHOLHDL 5.7   Thyroid Function Tests:  Recent Labs  11/04/16 0721  TSH 0.027*   Anemia Panel: No results for input(s): VITAMINB12, FOLATE, FERRITIN, TIBC, IRON, RETICCTPCT in the last 72 hours. Urine analysis:    Component Value Date/Time   COLORURINE STRAW (A) 11/03/2016 1540   APPEARANCEUR HAZY (A) 11/03/2016 1540   LABSPEC 1.006 11/03/2016 1540   PHURINE 5.0 11/03/2016 1540   GLUCOSEU NEGATIVE 11/03/2016 1540   HGBUR SMALL (A) 11/03/2016 1540   BILIRUBINUR NEGATIVE 11/03/2016 1540   BILIRUBINUR neg 10/19/2014 1635   KETONESUR NEGATIVE 11/03/2016 1540   PROTEINUR NEGATIVE 11/03/2016 1540   UROBILINOGEN negative 10/19/2014 1635   UROBILINOGEN 0.2 03/28/2010 0530   NITRITE NEGATIVE 11/03/2016 1540   LEUKOCYTESUR MODERATE (A) 11/03/2016 1540   Sepsis Labs: @LABRCNTIP (procalcitonin:4,lacticidven:4)  ) Recent Results (from the past 240 hour(s))  Culture, Urine     Status: Abnormal (Preliminary result)   Collection Time: 11/03/16 10:14 PM  Result Value Ref Range Status   Specimen Description URINE, CATHETERIZED  Final   Special Requests NONE  Final   Culture 80,000 COLONIES/mL GRAM NEGATIVE RODS (A)  Final   Report Status PENDING  Incomplete  MRSA PCR Screening     Status: None   Collection Time: 11/03/16 11:46 PM  Result Value Ref Range Status   MRSA by PCR NEGATIVE NEGATIVE Final    Comment:        The GeneXpert MRSA Assay (FDA approved for NASAL specimens only), is one component of  a comprehensive MRSA colonization surveillance program. It is not intended to diagnose MRSA infection nor to guide or monitor treatment for MRSA infections.          Radiology Studies: Dg Chest 2 View  Result Date: 11/03/2016 CLINICAL DATA:  Weakness, vomiting EXAM: CHEST  2 VIEW COMPARISON:  01/30/2014 FINDINGS: Mild patchy left lower lobe opacity, possibly reflecting chronic scarring when correlating with 2015, although superimposed pneumonia is not excluded. No pleural effusion or pneumothorax. Cardiomegaly. Visualized osseous structures are within normal limits. IMPRESSION: Mild patchy left lower lobe opacity, possibly chronic scarring, superimposed pneumonia not excluded. Electronically Signed   By: Bertis Ruddy  [place] Pink M.D.   On: 11/03/2016 16:59   Ct Head Wo Contrast  Result Date: 11/03/2016 CLINICAL DATA:  Altered mental status and vomiting. History of breast cancer. EXAM: CT HEAD WITHOUT CONTRAST TECHNIQUE: Contiguous axial images were obtained from the base of the skull through the vertex without intravenous contrast. COMPARISON:  03/28/2010 FINDINGS: Brain: No evidence of hemorrhage, hydrocephalus, extra-axial collection or mass effect. There is moderate brain parenchymal volume loss. Areas of hypoattenuation is seen in the subcortical white matter, most prominent in the left frontal lobe fold by a right frontal and right parietal lobes. Vascular: Calcific atherosclerotic disease at the skullbase. Skull: Normal. Negative for fracture or focal lesion. Sinuses/Orbits: No acute finding. Other: None. IMPRESSION: Areas of hypoattenuation in the subcortical white matter in left frontal, right frontal and right parietal lobes. These may represent areas of microangiopathy, however age-indeterminate, potentially watershed infarcts, or vasogenic edema cannot be excluded (particularly given patient's history of breast cancer). Further evaluation with brain MRI with contrast may be considered, if  found clinically indicated. Electronically Signed   By: Fidela Salisbury M.D.   On: 11/03/2016 17:24   Mr Jeri Cos [id number] Contrast  Result Date: 11/03/2016 CLINICAL DATA:  83 y/o F; history of breast cancer with abnormal CT of the head. EXAM: MRI HEAD WITHOUT AND WITH CONTRAST TECHNIQUE: Multiplanar, multiecho pulse sequences of the brain and surrounding structures were obtained without and with intravenous contrast. CONTRAST:  75mL MULTIHANCE GADOBENATE DIMEGLUMINE 529 MG/ML IV SOLN COMPARISON:  11/03/2016 CT head. FINDINGS: Brain: 6 mm focus of diffusion restriction within the right superior cerebellar peduncle/cerebellar junction (series 5, image 53) without enhanced. Small chronic infarct in the left superior frontal lobe. Scattered foci of T2 FLAIR hyperintensity within the pons, right caudate head, and bilateral lentiform nuclei greater on the left are compatible with chronic lacunar infarcts. Extensive prominent perivascular spaces of the basal ganglia. Background of moderate chronic microvascular ischemic changes and parenchymal volume loss of the brain. No evidence of intracranial hemorrhage, focal mass effect, extra-axial collection. No hydrocephalus. No abnormal enhancement of the brain. Vascular: Normal flow voids. Skull and upper cervical spine: Normal marrow signal. Sinuses/Orbits: Negative. Other: None. IMPRESSION: 1. 6 mm acute/early subacute infarct within the right superior cerebellar peduncle/cerebellar junction. No acute intracranial hemorrhage. 2. No evidence of intracranial metastatic disease. 3. Foci of hypoattenuation on CT correspond to multiple small infarctions and moderate chronic microvascular ischemic changes of the brain. 4. Moderate brain parenchymal volume loss. These results were called by telephone at the time of interpretation on 11/03/2016 at 8:39 pm to Dr. Nanda Quinton , who verbally acknowledged these results. Electronically Signed   By: Kristine Garbe M.D.   On:  11/03/2016 20:41   US Carotid Bilateral (at [facility] Only)  Result Date: 11/04/2016 CLINICAL DATA:  Altered mental status.  Parkinson's. EXAM: BILATERAL CAROTID DUPLEX ULTRASOUND TECHNIQUE: Pearline Cables scale imaging, color Doppler and duplex ultrasound were performed of bilateral carotid and vertebral arteries in the neck. COMPARISON:  None. FINDINGS: Criteria: Quantification of carotid stenosis is based on velocity parameters that correlate the residual internal carotid diameter with NASCET-based stenosis levels, using the diameter of the distal internal carotid lumen as the denominator for stenosis measurement. The following velocity measurements were obtained: RIGHT ICA:  55 cm/sec CCA:  62 cm/sec SYSTOLIC ICA/CCA RATIO:  0.9 DIASTOLIC ICA/CCA RATIO:  1.6 ECA:  82 cm/sec LEFT ICA:  150 cm/sec CCA:  54 cm/sec SYSTOLIC ICA/CCA RATIO:  2.6 DIASTOLIC ICA/CCA RATIO:  3.2 ECA:  92 cm/sec RIGHT CAROTID ARTERY: Small amount of echogenic plaque in the right common carotid artery without significant stenosis. Small amount of circumferential plaque at the right carotid bulb. Echogenic plaque at the origin of the external carotid artery. External carotid artery is patent with normal waveform. Small amount of plaque in the proximal internal carotid artery. Normal waveforms and velocities in the internal carotid artery. RIGHT VERTEBRAL ARTERY:  Not visualized. LEFT CAROTID ARTERY: Small amount of plaque throughout the left common carotid artery. Moderate amount of plaque at the left carotid bulb. External carotid artery is patent with normal waveform. Irregular plaque in the proximal internal carotid artery. Left internal carotid artery is poorly visualized and reportedly this is related to patient's mental status and ability to follow commands. Peak systolic velocity in the distal left internal carotid artery is elevated measuring up to 150 cm/sec. LEFT VERTEBRAL ARTERY:  Not visualized IMPRESSION: Technically challenging  examination. Bilateral vertebral arteries were not identified. Atherosclerotic disease in the bilateral carotid arteries. Estimated degree of stenosis in the right internal carotid artery is less than 50%. Estimated degree of stenosis in left internal carotid artery is between 50-69% but evaluation of the left internal carotid artery is markedly limited on this examination. The carotid and vertebral arteries may be better characterized with a MRA or CTA of the neck. Electronically Signed   By: Markus Daft M.D.   On: 11/04/2016 11:43        Scheduled Meds: .  stroke: mapping our early stages of recovery book   Does not apply Once  . aspirin EC  81 mg Oral Daily  . carbidopa-levodopa  1 tablet Oral TID  . donepezil  5 mg Oral QHS  . FLUoxetine  20 mg Oral Daily  . heparin  5,000 Units Subcutaneous Q8H  . levothyroxine  75 mcg Intravenous Daily  . QUEtiapine  25 mg Oral QHS  . risperiDONE  0.25 mg Oral QHS   Continuous Infusions:   LOS: 2 days    Time spent: 25 minutes. Greater than 50% of this time was spent in direct contact with the patient coordinating care.     Lelon Frohlich, MD [facility] Pager [phone]  If 7PM-7AM, please contact night-coverage [email or web address] Password [email or web address] 11/05/2016, 11:45 AM

## 2016-11-05 NOTE — Evaluation (Signed)
Physical Therapy Evaluation Patient Details Name: Katherine Roth MRN: [id number] DOB: 1933/03/24 Today's Date: 11/05/2016   History of Present Illness  Katherine Roth is a 83 y.o. female with medical history significant of HTN, HLD, Parkinson's dementia, hypothyroidism was sent to the ED from her nursing home ([facility]) for evaluation of episode of non bloody vomiting and confusion.   Clinical Impression  Pt was received in her bed, sitting up eating breakfast. She was agreeable to PT evaluation, reporting no pain. She has a history of Alzheimer's Dementia which made it difficult to get an idea of baseline function and equipment needs. Strength testing was deferred secondary to inability to follow directions, however she demonstrates overall weakness of the UE/LE, no noteable signs of strength asymmetry throughout the session. She was able to perform all bed mobility with modified independence and she was able to transition sit to/from stand at her bed and at the commode with no more than supervision/CGA. She ambulated up to 40ft with her RW, then was able to walk from her bathroom to her bed without an AD and CGA only. She demonstrates no signs of unsteadiness or LOB during the evaluation. She would benefit from HHPT upon return to the ALF and PT will continue to see her while at [facility].     Follow Up Recommendations Home health PT;Other (comment) (HHPT with return to ALF)    Equipment Recommendations  Rolling walker with 5" wheels    Recommendations for Other Services       Precautions / Restrictions Restrictions Weight Bearing Restrictions: No      Mobility  Bed Mobility Overal bed mobility: Modified Independent             General bed mobility comments: supine to sit and sit to supine  Transfers Overall transfer level: Modified independent Equipment used: Rolling walker (2 wheeled)             General transfer comment: Pt able to stand without use of  RW  Ambulation/Gait Ambulation/Gait assistance: Min guard;Supervision Ambulation Distance (Feet): 30 Feet Assistive device: Rolling walker (2 wheeled);None     Gait velocity interpretation: <1.8 ft/sec, indicative of risk for recurrent falls General Gait Details: Pt initially using RW for ~75ft, then went to the bathroom where she was able to perfrom bathroom activities without a RW and supervision assistance only. She then ambulated from her bathroom back to the bed without an AD and CGA. No signs of unsteadiness. Unsure of pt's baseline function with this activity.   Stairs            Wheelchair Mobility    Modified Rankin (Stroke Patients Only)       Balance Overall balance assessment: Needs assistance Sitting-balance support: Feet supported Sitting balance-Leahy Scale: Good     Standing balance support: Bilateral upper extremity supported Standing balance-Leahy Scale: Good                               Pertinent Vitals/Pain Pain Assessment: No/denies pain    Home Living Family/patient expects to be discharged to:: Assisted living                 Additional Comments: Pt stating she did not use any equipment prior to admission    Prior Function           Comments: Unsure of levele of assistance received at her ALF. Pt unable to clarify.  Hand Dominance        Extremity/Trunk Assessment   Upper Extremity Assessment Upper Extremity Assessment: Generalized weakness    Lower Extremity Assessment Lower Extremity Assessment: Generalized weakness       Communication   Communication: HOH  Cognition Arousal/Alertness: Awake/alert Behavior During Therapy: WFL for tasks assessed/performed Overall Cognitive Status: No family/caregiver present to determine baseline cognitive functioning                 General Comments: Pt with history of Alzheimer's Dementia    General Comments      Exercises     Assessment/Plan     PT Assessment Patient needs continued PT services  PT Problem List Decreased mobility;Decreased balance          PT Treatment Interventions Functional mobility training;Therapeutic activities;Gait training;Therapeutic exercise;Neuromuscular re-education;Balance training;Patient/family education    PT Goals (Current goals can be found in the Care Plan section)  Acute Rehab PT Goals PT Goal Formulation: Patient unable to participate in goal setting    Frequency Min 3X/week   Barriers to discharge   None at this time     Co-evaluation               End of Session   Activity Tolerance: Patient tolerated treatment well;No increased pain Patient left: in bed;with nursing/sitter in room;with call bell/phone within reach Nurse Communication: Mobility status    Functional Assessment Tool Used: Clinical judgement based on assessment of strength and mobility.  Functional Limitation: Mobility: Walking and moving around Mobility: Walking and Moving Around Current Status [phone]): At least 1 percent but less than 20 percent impaired, limited or restricted Mobility: Walking and Moving Around Goal Status [phone]): At least 1 percent but less than 20 percent impaired, limited or restricted    Time: 0855-0919 PT Time Calculation (min) (ACUTE ONLY): 24 min   Charges:   PT Evaluation $PT Eval Low Complexity: 1 Procedure PT Treatments $Therapeutic Activity: 8-22 mins   PT G Codes:   PT G-Codes **NOT FOR INPATIENT CLASS** Functional Assessment Tool Used: Clinical judgement based on assessment of strength and mobility.  Functional Limitation: Mobility: Walking and moving around Mobility: Walking and Moving Around Current Status [phone]): At least 1 percent but less than 20 percent impaired, limited or restricted Mobility: Walking and Moving Around Goal Status [phone]): At least 1 percent but less than 20 percent impaired, limited or restricted    10:55 AM,11/05/16 Elly Modena PT,  [facility] [phone]

## 2016-11-06 LAB — URINE CULTURE: Culture: 80000 — AB

## 2016-11-06 LAB — BASIC METABOLIC PANEL
ANION GAP: 8 (ref 5–15)
BUN: 17 mg/dL (ref 6–20)
CALCIUM: 9.2 mg/dL (ref 8.9–10.3)
CO2: 29 mmol/L (ref 22–32)
Chloride: 99 mmol/L — ABNORMAL LOW (ref 101–111)
Creatinine, Ser: 0.95 mg/dL (ref 0.44–1.00)
GFR calc Af Amer: >60 mL/min (ref >60)
GFR, EST NON AFRICAN AMERICAN: 55 mL/min — AB (ref >60)
GLUCOSE: 100 mg/dL — AB (ref 65–99)
Potassium: 3.6 mmol/L (ref 3.5–5.1)
SODIUM: 136 mmol/L (ref 135–145)

## 2016-11-06 MED ORDER — ROSUVASTATIN CALCIUM 5 MG PO TABS: 5.0000 mg | ORAL_TABLET | Freq: Every day | ORAL | 2 refills | Status: AC

## 2016-11-06 MED ORDER — CLOPIDOGREL BISULFATE 75 MG PO TABS: 75.0000 mg | ORAL_TABLET | Freq: Every day | ORAL | 2 refills | Status: AC

## 2016-11-06 NOTE — NC FL2 (Signed)
[place] LEVEL OF CARE SCREENING TOOL     IDENTIFICATION  Patient Name: Katherine Roth Birthdate: Feb 13, 1933 Sex: female Admission Date (Current Location): 11/03/2016  [place] and [place] Number:  [place] [id number] [place] and Address:  [place] [address] [place]      Provider Number: [id number]  Attending Physician Name and Address:  Koleen Nimrod Acost*  Relative Name and Phone Number:       Current Level of Care: Hospital Recommended Level of Care: [place] Prior Approval Number:    Date Approved/Denied:   PASRR Number: [id number]  Discharge Plan: Other (Comment) (ALF)    Current Diagnoses: Patient Active Problem List   Diagnosis Date Noted  . CVA (cerebral vascular accident) ([place]) 11/03/2016  . UTI (urinary tract infection) 11/03/2016  . Memory deficit 07/24/2016  . Hypokalemia 01/24/2016  . Metabolic syndrome [id number]  . Has a tremor 03/25/2014  . Breast CA ([place]) 03/25/2014  . Essential (primary) hypertension 03/25/2014  . Hypothyroidism 01/31/2014  . Breast mass, right 01/31/2014  . Hyperlipidemia 01/31/2014  . Parkinsonism ([place]) 09/25/2013  . Gait instability 09/25/2013  . Hallucinations 09/25/2013  . Arthritis 07/22/2013  . H/O: CVA (cerebrovascular accident) 07/22/2013    Orientation RESPIRATION BLADDER Height & Weight     Self  Normal Incontinent Weight: 139 lb 3.2 oz (63.1 kg) Height:  5\' 3"  (160 cm)  BEHAVIORAL SYMPTOMS/MOOD NEUROLOGICAL BOWEL NUTRITION STATUS  Other (Comment) (none)  (n/a) Continent Diet (Regular)  AMBULATORY STATUS COMMUNICATION OF NEEDS Skin   Limited Assist Verbally Normal                       Personal Care Assistance Level of Assistance  Bathing, Feeding, Dressing Bathing Assistance: Limited assistance Feeding assistance: Limited assistance Dressing Assistance: Limited assistance     Functional Limitations Info  Sight, Hearing,  Speech Sight Info: Adequate Hearing Info: Adequate Speech Info: Adequate    SPECIAL CARE FACTORS FREQUENCY  PT (By licensed PT)     PT Frequency: home health              Contractures      Additional Factors Info  Psychotropic Code Status Info: Full code Allergies Info: No known allergies Psychotropic Info: Seroquel, Risperdal         Current Medications (11/06/2016):  This is the current hospital active medication list Current Facility-Administered Medications  Medication Dose Route Frequency Provider Last Rate Last Dose  . acetaminophen (TYLENOL) tablet 650 mg  650 mg Oral Q4H PRN Ankit Arsenio Loader, MD       Or  . acetaminophen (TYLENOL) solution 650 mg  650 mg Per Tube Q4H PRN Ankit Arsenio Loader, MD       Or  . acetaminophen (TYLENOL) suppository 650 mg  650 mg Rectal Q4H PRN Ankit Chirag Amin, MD      . carbidopa-levodopa (SINEMET IR) 25-100 MG per tablet immediate release 1 tablet  1 tablet Oral TID Ankit Arsenio Loader, MD   1 tablet at 11/05/16 2242  . clopidogrel (PLAVIX) tablet 75 mg  75 mg Oral Daily Erline Hau, MD   75 mg at 11/05/16 1000  . donepezil (ARICEPT) tablet 5 mg  5 mg Oral QHS Ankit Arsenio Loader, MD   5 mg at 11/05/16 2242  . FLUoxetine (PROZAC) capsule 20 mg  20 mg Oral Daily Ankit Chirag Amin, MD   20 mg at 11/05/16 0800  . heparin  injection 5,000 Units  5,000 Units Subcutaneous Q8H Ankit Arsenio Loader, MD   5,000 Units at 11/06/16 0450  . levothyroxine (SYNTHROID, LEVOTHROID) injection 75 mcg  75 mcg Intravenous Daily Ankit Arsenio Loader, MD   75 mcg at 11/05/16 1457  . QUEtiapine (SEROQUEL) tablet 25 mg  25 mg Oral QHS Ankit Arsenio Loader, MD   25 mg at 11/05/16 2241  . risperiDONE (RISPERDAL) tablet 0.25 mg  0.25 mg Oral QHS Ankit Chirag Amin, MD   0.25 mg at 11/05/16 2242  . rosuvastatin (CRESTOR) tablet 5 mg  5 mg Oral q1800 Erline Hau, MD   5 mg at 11/05/16 1845  . senna-docusate (Senokot-S) tablet 1 tablet  1 tablet Oral QHS  PRN Ankit Arsenio Loader, MD         Discharge Medications: Medication List    STOP taking these medications   aspirin 81 MG tablet   losartan 50 MG tablet Commonly known as:  COZAAR   potassium chloride 10 MEQ CR capsule Commonly known as:  MICRO-K     TAKE these medications   atenolol-chlorthalidone 100-25 MG tablet Commonly known as:  TENORETIC Take 1 tablet by mouth daily.   carbidopa-levodopa 25-100 MG tablet Commonly known as:  SINEMET IR TAKE 1 TABLET BY MOUTH 3 (THREE) TIMES DAILY.   clopidogrel 75 MG tablet Commonly known as:  PLAVIX Take 1 tablet (75 mg total) by mouth daily.   donepezil 5 MG tablet Commonly known as:  ARICEPT Take 5 mg by mouth at bedtime.   FLUoxetine 20 MG capsule Commonly known as:  PROZAC TAKE 1 CAPSULE EVERY DAY   levothyroxine 112 MCG tablet Commonly known as:  SYNTHROID Take 1 tablet (112 mcg total) by mouth daily before breakfast.   QUEtiapine 25 MG tablet Commonly known as:  SEROQUEL TAKE 1 TABLET (25 MG TOTAL) BY MOUTH AT BEDTIME.   risperiDONE 0.25 MG tablet Commonly known as:  RISPERDAL TAKE 1 TABLET (0.25 MG TOTAL) BY MOUTH AT BEDTIME.   rosuvastatin 5 MG tablet Commonly known as:  CRESTOR Take 1 tablet (5 mg total) by mouth daily at 6 PM.     Relevant Imaging Results:  Relevant Lab Results:   Additional [place], [place], [place]

## 2016-11-06 NOTE — Care Management Important Message (Signed)
Important Message  Patient Details  Name: Katherine Roth MRN: [id number] Date of Birth: 04-07-1933   Medicare Important Message Given:  Yes    Sherald Barge, RN 11/06/2016, 12:55 PM

## 2016-11-06 NOTE — Care Management Note (Signed)
Case Management Note  Patient Details  Name: Katherine Roth MRN: [id number] Date of Birth: 1933-09-16  Subjective/Objective:                  Pt admitted with CVA. She is from [facility]. She is returning to facility today. CSW making arrangements of return. PT has recommended HH PT. Facility contracts with [facility], Vickie of [facility], aware of referral and will obtain pt info from chart. Facility aware that [facility] has 48hrs to initiate services. PT recommends use of RW. Per pt's daughter, pt has RW in her room at facility.   Action/Plan: Discharging today back to ALF with [facility] PT services through [facility].   Expected Discharge Date:  11/06/16               Expected Discharge Plan:  Assisted Living / Rest Home (with PT services)  In-House Referral:  Clinical Social Work  Discharge planning Services  CM Consult  Post Acute Care Choice:  Home Health Choice offered to:  Patient  HH Arranged:  PT [place]:  [place]  Status of Service:  Completed, signed off  Sherald Barge, RN 11/06/2016, 12:53 PM

## 2016-11-06 NOTE — Discharge Summary (Signed)
Physician Discharge Summary  Katherine Roth [id number] DOB: Aug 20, 1933 DOA: 11/03/2016  PCP: Katherine Gainer, MD  Admit date: 11/03/2016 Discharge date: 11/06/2016  Time spent: 45 minutes  Recommendations for Outpatient Follow-up:  -Will be discharged back to ALF today. -[place] services have been arranged.   Discharge Diagnoses:  Active Problems:   Arthritis   Parkinsonism ([place])   Gait instability   Hypothyroidism   Hyperlipidemia   Has a tremor   Breast CA ([place])   Essential (primary) hypertension   Memory deficit   CVA (cerebral vascular accident) ([place])   UTI (urinary tract infection)   Discharge Condition: Stable and improved  Filed Weights   11/03/16 1513 11/03/16 2313  Weight: 66.2 kg (146 lb) 63.1 kg (139 lb 3.2 oz)    History of present illness:  As per Dr. Reesa Roth on 1/26:  Katherine Roth is a 83 y.o. female with medical history significant of HTN, HLD, Parkinson's dementia, hypothyroidism was sent to the ED from her nursing home ([facility]) for evaluation of episode of non bloody vomiting and confusion.  Hx is very limited and is per ED staff. I attempted to call the facility and POA but no response. Apparently the nurse who took care of her at the facility had gone for the day.  Patient had an episode of nonbloody vomiting earlier and seemed confused per the nurse therefore sent here. She also had 2 episodes of urinary incontinence. Her baseline mental status is unknown at this time.   In the ED due to her confusion, CT head was done which showed possible CVA and it was confirmed with the MRI. She has acute 24mm infarct in the right superior cerebellar peduncle/cerebellar junction with chronic blood disease. Case was discussed with Dr Katherine Roth who recommended admitting here at [facility] for further care and doesn't need to go to [facility].  Her UA showed mild UTI with mild leukocytosis on her CBC. She was started on Rocephin.   Her Code status  is unknown at this time due to her mental status and unable to reach POA, [name].   Hospital Course:   Acute CVA -MRI shows a 6 mm acute infarct within the right superior cerebellar peduncle with no acute intracranial hemorrhage. -Echo: EF 55-60%, mild LVH, no WMA. Poor study. -Carotid Dopplers: Limited study; right ICA 50%, left ICA 50-69% stenosis. Vertebrals not assessed. -PT recs HHPT. -ST has recommended regular diet following swallowing evaluation. -Upgrade ASA to plavix for secondary stroke prevention. -LDL 107, start statin. -No arrhythmias noted on telemetry.  Hypothyroidism -Continue synthroid for now  Parkinson's dementia -Continue home medications once able to take by mouth medications  Hypertension -Allow permissive hypertension in the face of acute CVA.   Questionable UTI -Do not believe this is accurate based on results of urinalysis. -Will discontinue Rocephin.  Procedures:  ECHO/carotid dopplers as above  Consultations:  None  Discharge Instructions  Discharge Instructions    Face-to-face encounter (required for Medicare/Medicaid patients)    Complete by:  As directed    I Katherine Roth certify that this patient is under my care and that I, or a nurse practitioner or physician's assistant working with me, had a face-to-face encounter that meets the physician face-to-face encounter requirements with this patient on 11/06/2016. The encounter with the patient was in whole, or in part for the following medical condition(s) which is the primary reason for home health care (List medical condition): acute CVA   The  encounter with the patient was in whole, or in part, for the following medical condition, which is the primary reason for home health care:  acute CVA   I certify that, based on my findings, the following services are medically necessary home health services:  Physical therapy   Reason for Medically Necessary Home Health Services:   Therapy- Therapeutic Exercises to Increase Strength and Endurance   My clinical findings support the need for the above services:  Unable to leave home safely without assistance and/or assistive device   Further, I certify that my clinical findings support that this patient is homebound due to:  Unable to leave home safely without assistance   Home Health    Complete by:  As directed    To provide the following care/treatments:  PT     Allergies as of 11/06/2016   No Known Allergies     Medication List    STOP taking these medications   aspirin 81 MG tablet   losartan 50 MG tablet Commonly known as:  COZAAR   potassium chloride 10 MEQ CR capsule Commonly known as:  MICRO-K     TAKE these medications   atenolol-chlorthalidone 100-25 MG tablet Commonly known as:  TENORETIC Take 1 tablet by mouth daily.   carbidopa-levodopa 25-100 MG tablet Commonly known as:  SINEMET IR TAKE 1 TABLET BY MOUTH 3 (THREE) TIMES DAILY.   clopidogrel 75 MG tablet Commonly known as:  PLAVIX Take 1 tablet (75 mg total) by mouth daily.   donepezil 5 MG tablet Commonly known as:  ARICEPT Take 5 mg by mouth at bedtime.   FLUoxetine 20 MG capsule Commonly known as:  PROZAC TAKE 1 CAPSULE EVERY DAY   levothyroxine 112 MCG tablet Commonly known as:  SYNTHROID Take 1 tablet (112 mcg total) by mouth daily before breakfast.   QUEtiapine 25 MG tablet Commonly known as:  SEROQUEL TAKE 1 TABLET (25 MG TOTAL) BY MOUTH AT BEDTIME.   risperiDONE 0.25 MG tablet Commonly known as:  RISPERDAL TAKE 1 TABLET (0.25 MG TOTAL) BY MOUTH AT BEDTIME.   rosuvastatin 5 MG tablet Commonly known as:  CRESTOR Take 1 tablet (5 mg total) by mouth daily at 6 PM.      No Known Allergies Follow-up Information    Katherine Gainer, MD. Schedule an appointment as soon as possible for a visit in 2 week(s).   Specialty:  Family Medicine Contact information: [place] [address] [phone]              The results of significant diagnostics from this hospitalization (including imaging, microbiology, ancillary and laboratory) are listed below for reference.    Significant Diagnostic Studies: Dg Chest 2 View  Result Date: 11/03/2016 CLINICAL DATA:  Weakness, vomiting EXAM: CHEST  2 VIEW COMPARISON:  01/30/2014 FINDINGS: Mild patchy left lower lobe opacity, possibly reflecting chronic scarring when correlating with 2015, although superimposed pneumonia is not excluded. No pleural effusion or pneumothorax. Cardiomegaly. Visualized osseous structures are within normal limits. IMPRESSION: Mild patchy left lower lobe opacity, possibly chronic scarring, superimposed pneumonia not excluded. Electronically Signed   By: Julian Hy M.D.   On: 11/03/2016 16:59   Ct Head Wo Contrast  Result Date: 11/03/2016 CLINICAL DATA:  Altered mental status and vomiting. History of breast cancer. EXAM: CT HEAD WITHOUT CONTRAST TECHNIQUE: Contiguous axial images were obtained from the base of the skull through the vertex without intravenous contrast. COMPARISON:  03/28/2010 FINDINGS: Brain: No evidence of hemorrhage, hydrocephalus,  extra-axial collection or mass effect. There is moderate brain parenchymal volume loss. Areas of hypoattenuation is seen in the subcortical white matter, most prominent in the left frontal lobe fold by a right frontal and right parietal lobes. Vascular: Calcific atherosclerotic disease at the skullbase. Skull: Normal. Negative for fracture or focal lesion. Sinuses/Orbits: No acute finding. Other: None. IMPRESSION: Areas of hypoattenuation in the subcortical white matter in left frontal, right frontal and right parietal lobes. These may represent areas of microangiopathy, however age-indeterminate, potentially watershed infarcts, or vasogenic edema cannot be excluded (particularly given patient's history of breast cancer). Further evaluation with brain MRI with contrast may be considered,  if found clinically indicated. Electronically Signed   By: Fidela Salisbury M.D.   On: 11/03/2016 17:24   Mr Jeri Cos [id number] Contrast  Result Date: 11/03/2016 CLINICAL DATA:  83 y/o F; history of breast cancer with abnormal CT of the head. EXAM: MRI HEAD WITHOUT AND WITH CONTRAST TECHNIQUE: Multiplanar, multiecho pulse sequences of the brain and surrounding structures were obtained without and with intravenous contrast. CONTRAST:  30mL MULTIHANCE GADOBENATE DIMEGLUMINE 529 MG/ML IV SOLN COMPARISON:  11/03/2016 CT head. FINDINGS: Brain: 6 mm focus of diffusion restriction within the right superior cerebellar peduncle/cerebellar junction (series 5, image 53) without enhanced. Small chronic infarct in the left superior frontal lobe. Scattered foci of T2 FLAIR hyperintensity within the pons, right caudate head, and bilateral lentiform nuclei greater on the left are compatible with chronic lacunar infarcts. Extensive prominent perivascular spaces of the basal ganglia. Background of moderate chronic microvascular ischemic changes and parenchymal volume loss of the brain. No evidence of intracranial hemorrhage, focal mass effect, extra-axial collection. No hydrocephalus. No abnormal enhancement of the brain. Vascular: Normal flow voids. Skull and upper cervical spine: Normal marrow signal. Sinuses/Orbits: Negative. Other: None. IMPRESSION: 1. 6 mm acute/early subacute infarct within the right superior cerebellar peduncle/cerebellar junction. No acute intracranial hemorrhage. 2. No evidence of intracranial metastatic disease. 3. Foci of hypoattenuation on CT correspond to multiple small infarctions and moderate chronic microvascular ischemic changes of the brain. 4. Moderate brain parenchymal volume loss. These results were called by telephone at the time of interpretation on 11/03/2016 at 8:39 pm to Dr. Nanda Quinton , who verbally acknowledged these results. Electronically Signed   By: Kristine Garbe M.D.   On:  11/03/2016 20:41   US Carotid Bilateral (at [facility] Only)  Result Date: 11/04/2016 CLINICAL DATA:  Altered mental status.  Parkinson's. EXAM: BILATERAL CAROTID DUPLEX ULTRASOUND TECHNIQUE: Pearline Cables scale imaging, color Doppler and duplex ultrasound were performed of bilateral carotid and vertebral arteries in the neck. COMPARISON:  None. FINDINGS: Criteria: Quantification of carotid stenosis is based on velocity parameters that correlate the residual internal carotid diameter with NASCET-based stenosis levels, using the diameter of the distal internal carotid lumen as the denominator for stenosis measurement. The following velocity measurements were obtained: RIGHT ICA:  55 cm/sec CCA:  62 cm/sec SYSTOLIC ICA/CCA RATIO:  0.9 DIASTOLIC ICA/CCA RATIO:  1.6 ECA:  82 cm/sec LEFT ICA:  150 cm/sec CCA:  54 cm/sec SYSTOLIC ICA/CCA RATIO:  2.6 DIASTOLIC ICA/CCA RATIO:  3.2 ECA:  92 cm/sec RIGHT CAROTID ARTERY: Small amount of echogenic plaque in the right common carotid artery without significant stenosis. Small amount of circumferential plaque at the right carotid bulb. Echogenic plaque at the origin of the external carotid artery. External carotid artery is patent with normal waveform. Small amount of plaque in the proximal internal carotid artery. Normal waveforms and velocities in  the internal carotid artery. RIGHT VERTEBRAL ARTERY:  Not visualized. LEFT CAROTID ARTERY: Small amount of plaque throughout the left common carotid artery. Moderate amount of plaque at the left carotid bulb. External carotid artery is patent with normal waveform. Irregular plaque in the proximal internal carotid artery. Left internal carotid artery is poorly visualized and reportedly this is related to patient's mental status and ability to follow commands. Peak systolic velocity in the distal left internal carotid artery is elevated measuring up to 150 cm/sec. LEFT VERTEBRAL ARTERY:  Not visualized IMPRESSION: Technically challenging  examination. Bilateral vertebral arteries were not identified. Atherosclerotic disease in the bilateral carotid arteries. Estimated degree of stenosis in the right internal carotid artery is less than 50%. Estimated degree of stenosis in left internal carotid artery is between 50-69% but evaluation of the left internal carotid artery is markedly limited on this examination. The carotid and vertebral arteries may be better characterized with a MRA or CTA of the neck. Electronically Signed   By: Markus Daft M.D.   On: 11/04/2016 11:43    Microbiology: Recent Results (from the past 240 hour(s))  Culture, Urine     Status: Abnormal   Collection Time: 11/03/16 10:14 PM  Result Value Ref Range Status   Specimen Description URINE, CATHETERIZED  Final   Special Requests NONE  Final   Culture 80,000 COLONIES/mL ESCHERICHIA COLI (A)  Final   Report Status 11/06/2016 FINAL  Final   Organism ID, Bacteria ESCHERICHIA COLI (A)  Final      Susceptibility   Escherichia coli - MIC*    AMPICILLIN 8 SENSITIVE Sensitive     CEFAZOLIN <=4 SENSITIVE Sensitive     CEFTRIAXONE <=1 SENSITIVE Sensitive     CIPROFLOXACIN <=0.25 SENSITIVE Sensitive     GENTAMICIN <=1 SENSITIVE Sensitive     IMIPENEM <=0.25 SENSITIVE Sensitive     NITROFURANTOIN <=16 SENSITIVE Sensitive     TRIMETH/SULFA <=20 SENSITIVE Sensitive     AMPICILLIN/SULBACTAM <=2 SENSITIVE Sensitive     PIP/TAZO <=4 SENSITIVE Sensitive     Extended ESBL NEGATIVE Sensitive     * 80,000 COLONIES/mL ESCHERICHIA COLI  MRSA PCR Screening     Status: None   Collection Time: 11/03/16 11:46 PM  Result Value Ref Range Status   MRSA by PCR NEGATIVE NEGATIVE Final    Comment:        The GeneXpert MRSA Assay (FDA approved for NASAL specimens only), is one component of a comprehensive MRSA colonization surveillance program. It is not intended to diagnose MRSA infection nor to guide or monitor treatment for MRSA infections.      Labs: Basic Metabolic  Panel:  Recent Labs Lab 11/03/16 1546 11/04/16 0721 11/06/16 0926  NA 138 136 136  K 3.4* 2.7* 3.6  CL 94* 94* 99*  CO2 35* 32 29  GLUCOSE 122* 95 100*  BUN 12 17 17   CREATININE 1.11* 1.15* 0.95  CALCIUM 9.8 9.1 9.2  MG  --  1.8  --    Liver Function Tests:  Recent Labs Lab 11/03/16 1546  AST 17  ALT 7*  ALKPHOS 89  BILITOT 1.1  PROT 8.2*  ALBUMIN 3.6    Recent Labs Lab 11/03/16 1546  LIPASE 27   No results for input(s): AMMONIA in the last 168 hours. CBC:  Recent Labs Lab 11/03/16 1546 11/04/16 0721  WBC 13.2* 9.9  NEUTROABS 8.0*  --   HGB 16.1* 15.3*  HCT 45.6 43.8  MCV 86.2 85.2  PLT 317 302  Cardiac Enzymes:  Recent Labs Lab 11/03/16 1546  TROPONINI <0.03   BNP: BNP (last 3 results) No results for input(s): BNP in the last 8760 hours.  ProBNP (last 3 results) No results for input(s): PROBNP in the last 8760 hours.  CBG: No results for input(s): GLUCAP in the last 168 hours.     SignedLelon Frohlich  [facility] Pager: [phone] 11/06/2016, 10:18 AM

## 2016-11-06 NOTE — Progress Notes (Signed)
Pt sent back to [place] with personal belongings and prescriptions. Pt IV removed and sie intact.

## 2016-11-06 NOTE — Clinical Social Work Note (Signed)
CSW notified that pt is from [place]. Pt's daughter, Orlando Penner states she is HCPOA and was not notified of admission. She is aware that pt is ready for d/c. Per Sharyn Lull at facility, pt requires one person assist with ambulation due to unsteady gait. She is aware of PT evaluation recommending home health PT and CM will arrange. Facility to pick up pt this afternoon.   Benay Pike, [place]

## 2016-11-14 ENCOUNTER — Encounter: Payer: Self-pay | Admitting: Family

## 2016-11-14 ENCOUNTER — Ambulatory Visit (INDEPENDENT_AMBULATORY_CARE_PROVIDER_SITE_OTHER): Payer: Medicare Other | Admitting: Family

## 2016-11-14 VITALS — BP 153/77 | HR 51 | Temp 97.1°F | Ht 63.0 in | Wt 138.0 lb

## 2016-11-14 DIAGNOSIS — I6930 Unspecified sequelae of cerebral infarction: Secondary | ICD-10-CM

## 2016-11-14 DIAGNOSIS — I10 Essential (primary) hypertension: Secondary | ICD-10-CM

## 2016-11-14 DIAGNOSIS — Z09 Encounter for follow-up examination after completed treatment for conditions other than malignant neoplasm: Secondary | ICD-10-CM

## 2016-11-14 DIAGNOSIS — G20 Parkinson's disease: Secondary | ICD-10-CM

## 2016-11-14 DIAGNOSIS — G20C Parkinsonism, unspecified: Secondary | ICD-10-CM

## 2016-11-14 LAB — CMP14+EGFR
ALT: 5 IU/L (ref 0–32)
AST: 14 IU/L (ref 0–40)
Albumin/Globulin Ratio: 1.2 (ref 1.2–2.2)
Albumin: 3.8 g/dL (ref 3.5–4.7)
Alkaline Phosphatase: 85 IU/L (ref 39–117)
BUN/Creatinine Ratio: 14 (ref 12–28)
BUN: 15 mg/dL (ref 8–27)
Bilirubin Total: 0.7 mg/dL (ref 0.0–1.2)
CALCIUM: 9.9 mg/dL (ref 8.7–10.3)
CO2: 30 mmol/L — AB (ref 18–29)
CREATININE: 1.04 mg/dL — AB (ref 0.57–1.00)
Chloride: 95 mmol/L — ABNORMAL LOW (ref 96–106)
GFR calc Af Amer: 58 mL/min/{1.73_m2} — ABNORMAL LOW (ref >59)
GFR calc non Af Amer: 51 mL/min/{1.73_m2} — ABNORMAL LOW (ref >59)
GLUCOSE: 110 mg/dL — AB (ref 65–99)
Globulin, Total: 3.3 g/dL (ref 1.5–4.5)
Potassium: 2.9 mmol/L — ABNORMAL LOW (ref 3.5–5.2)
Sodium: 141 mmol/L (ref 134–144)
Total Protein: 7.1 g/dL (ref 6.0–8.5)

## 2016-11-14 LAB — CBC WITH DIFFERENTIAL/PLATELET
BASOS: 1 %
Basophils Absolute: 0.1 10*3/uL (ref 0.0–0.2)
EOS (ABSOLUTE): 0.6 10*3/uL — ABNORMAL HIGH (ref 0.0–0.4)
EOS: 5 %
HEMATOCRIT: 43.3 % (ref 34.0–46.6)
Hemoglobin: 14.6 g/dL (ref 11.1–15.9)
IMMATURE GRANS (ABS): 0 10*3/uL (ref 0.0–0.1)
IMMATURE GRANULOCYTES: 0 %
LYMPHS: 28 %
Lymphocytes Absolute: 3.3 10*3/uL — ABNORMAL HIGH (ref 0.7–3.1)
MCH: 29.7 pg (ref 26.6–33.0)
MCHC: 33.7 g/dL (ref 31.5–35.7)
MCV: 88 fL (ref 79–97)
Monocytes Absolute: 1.1 10*3/uL — ABNORMAL HIGH (ref 0.1–0.9)
Monocytes: 9 %
NEUTROS PCT: 57 %
Neutrophils Absolute: 6.9 10*3/uL (ref 1.4–7.0)
Platelets: 326 10*3/uL (ref 150–379)
RBC: 4.91 x10E6/uL (ref 3.77–5.28)
RDW: 13 % (ref 12.3–15.4)
WBC: 12 10*3/uL — AB (ref 3.4–10.8)

## 2016-11-14 NOTE — Patient Instructions (Signed)
Fall Prevention in Hospitals, Adult WHAT ARE SOME SAFETY TIPS FOR PREVENTING FALLS? If you or a loved one has to stay in the hospital, talk with the health care providers about the risk of falling. Find out which medicines or treatments can cause dizziness or affect balance. Make a plan with the health care providers to prevent falls. The plan may include these points:  Ask for help moving around, especially after surgery or when feeling unwell.  Have support available when getting up and moving around.  Wear nonskid footwear.  Use the safety straps on wheelchairs.  Ask for help to get objects that are out of reach.  Wear eyeglasses.  Remove all clutter from the floor and the sides of the bed.  Keep equipment and wires securely out of the way.  Keep the bed locked in the low position.  Keep the side rails up on the bed.  Keep the nurse call button within reach.  Keep the door open when no one else is in the room.  Have someone stay in the hospital with you or your loved one.  Ask for a bed alarm if you are not able to stay with your loved one who is at risk for getting up without help.  Ask if sleeping pills or other medicines that alter mental status are necessary. WHAT INCREASES THE RISK FOR FALLS? Certain conditions and treatments may increase a patient's risk for falls in a hospital. These include:  Being in an unfamiliar environment.  Being on bed rest.  Having surgery.  Taking certain medicines, such as sleeping pills.  Having tubes in place, such as IV lines or catheters. Additional risk factors for falls in a hospital include:  Having vision problems.  Having a change in thinking, feeling, or behavior (altered mental status).  Having trouble with balance.  Needing to use the toilet frequently.  Having fallen in the past three months.  Having low blood pressure when standing up quickly (orthostatic hypotension). WHAT DOES THE HOSPITAL STAFF DO TO HELP  PREVENT ME OR MY LOVED ONE FROM FALLING? Hospitals have systems in place to prevent falls and accidents. Talk with the hospital staff about:  Doing an assessment to discuss fall risks and create a personalized plan to prevent falls.  Checking in regularly to see if help is needed for moving around and to assess any changes in fall risk.  Knowing where the nurse call button is and how to use it. Use this to call a nursing care provider any time.  Keeping personal items within reach. This includes eyeglasses, phones, and other electronic devices.  Following general safety guidelines when moving around.  Keeping the area around the bed free from clutter.  Removing unnecessary equipment or tubes to reduce the risk of tripping.  Using safety equipment, such as:  Walkers, crutches, and other walking devices for support.  Safety rails on the bed.  Safety straps in the bed.  A bed that can be lowered and locked to prevent movement.  Handrails in the bathroom.  Nonskid socks and shoes.  Locking mechanisms to secure equipment in place.  Lifting and transfer equipment. WHAT CAN I DO TO HELP PREVENT A FALL?  Talk with health care providers about fall prevention.  Have a personalized fall prevention plan in place.  Do not try to move around if you feel off balance or ill.  Change position slowly.  Sit on the side of the bed before standing up.  Sit down and call   for help if you feel dizzy or unsteady when standing.  Keep the hospital room clear of clutter. WHEN SHOULD I ASK FOR HELP? Ask for help whenever you:  Are not sure if you are able to move around safely.  Feel dizzy or unsteady.  Are not comfortable helping your loved one move around or use the bathroom. If you or a loved one falls, tell the hospital staff. This is important. This information is not intended to replace advice given to you by your health care provider. Make sure you discuss any questions you have  with your health care provider. Document Released: 09/22/2000 Document Revised: 02/22/2016 Document Reviewed: 07/08/2015 Elsevier Interactive Patient Education  2017 Elsevier Inc.  

## 2016-11-14 NOTE — Progress Notes (Signed)
   Subjective:    Patient ID: Katherine Roth, female    DOB: 1933-09-22, 83 y.o.   MRN: [id number]  HPI Pt presents to the office today for hospital follow up. Pt was admitted to [facility] on 11/06/16 for confusion and had CT and MRI of head that showed she "had acute 38m infarct in the right superior cerebellar peduncle/cerebellar junction with chronic blood disease." She also had mild UTI.  [facility]. Pt states she is "doing fine" and is not in any pain. PT was started on Plavix.   [facility]   Review of Systems  All other systems reviewed and are negative.      Objective:   Physical Exam  Constitutional: She is oriented to person, place, and time. She appears well-developed and well-nourished. No distress.  HENT:  Head: Normocephalic and atraumatic.  Right Ear: External ear normal.  Left Ear: External ear normal.  Nose: Nose normal.  Mouth/Throat: Oropharynx is clear and moist.  Eyes: Pupils are equal, round, and reactive to light.  Neck: Normal range of motion. Neck supple. No thyromegaly present.  Cardiovascular: Normal rate, regular rhythm, normal heart sounds and intact distal pulses.   No murmur heard. Pulmonary/Chest: Effort normal and breath sounds normal. No respiratory distress. She has no wheezes.  Abdominal: Soft. Bowel sounds are normal. She exhibits no distension. There is no tenderness.  Musculoskeletal: Normal range of motion. She exhibits no edema or tenderness.  Neurological: She is alert and oriented to person, place, and time.  Skin: Skin is warm and dry.  Psychiatric: She has a normal mood and affect. Her behavior is normal. Judgment and thought content normal.  Flat affect   Vitals reviewed.     BP (!) 153/77   Pulse (!) 51   Temp 97.1 F (36.2 C) (Oral)   Ht '5\' 3"'$  (1.6 m)   Wt 138 lb (62.6 kg)   BMI 24.45 kg/m      Assessment & Plan:  1. Late effect of cerebrovascular  accident (CVA) -Resolved at this time - CMP14+EGFR - CBC with Differential/Platelet  2. Essential (primary) hypertension - CMP14+EGFR - CBC with Differential/Platelet  3. Primary Parkinsonism ([place] - CMP14+EGFR - CBC with Differential/Platelet  4. Hospital discharge follow-up - CMP14+EGFR - CBC with Differential/Platelet  Labs pending Falls precaution discussed RTO prn  CEvelina Dun FNP

## 2016-11-16 ENCOUNTER — Encounter: Payer: Self-pay | Admitting: *Deleted

## 2016-11-16 ENCOUNTER — Other Ambulatory Visit: Payer: Self-pay | Admitting: Family

## 2016-11-16 MED ORDER — POTASSIUM CHLORIDE 20 MEQ PO PACK: 20.0000 meq | PACK | Freq: Two times a day (BID) | ORAL | 3 refills | Status: DC

## 2016-12-01 ENCOUNTER — Other Ambulatory Visit: Payer: Self-pay | Admitting: Family

## 2016-12-01 ENCOUNTER — Other Ambulatory Visit: Payer: Medicare Other

## 2016-12-01 ENCOUNTER — Telehealth: Payer: Self-pay | Admitting: *Deleted

## 2016-12-01 DIAGNOSIS — R410 Disorientation, unspecified: Secondary | ICD-10-CM

## 2016-12-01 LAB — URINALYSIS, COMPLETE
Bilirubin, UA: NEGATIVE
Glucose, UA: NEGATIVE
Ketones, UA: NEGATIVE
NITRITE UA: POSITIVE — AB
Protein, UA: NEGATIVE
SPEC GRAV UA: 1.02 (ref 1.005–1.030)
Urobilinogen, Ur: 0.2 mg/dL (ref 0.2–1.0)
pH, UA: 6 (ref 5.0–7.5)

## 2016-12-01 LAB — MICROSCOPIC EXAMINATION
Renal Epithel, UA: NONE SEEN /hpf
WBC, UA: >30 /hpf — AB (ref 0–?)

## 2016-12-01 MED ORDER — SULFAMETHOXAZOLE-TRIMETHOPRIM 800-160 MG PO TABS: 1.0000 | ORAL_TABLET | Freq: Two times a day (BID) | ORAL | 0 refills | Status: DC

## 2016-12-01 NOTE — Telephone Encounter (Signed)
[facility] staff aware of new antibiotic at pharmacy.   Patient test result is positive for bladder infection .

## 2016-12-03 LAB — URINE CULTURE

## 2016-12-04 ENCOUNTER — Other Ambulatory Visit: Payer: Self-pay | Admitting: Family

## 2017-01-16 ENCOUNTER — Ambulatory Visit (INDEPENDENT_AMBULATORY_CARE_PROVIDER_SITE_OTHER): Payer: Medicare Other | Admitting: Family

## 2017-01-16 ENCOUNTER — Encounter: Payer: Self-pay | Admitting: Family

## 2017-01-16 VITALS — BP 132/71 | HR 60 | Temp 98.2°F | Ht 63.0 in | Wt 136.2 lb

## 2017-01-16 DIAGNOSIS — N3001 Acute cystitis with hematuria: Secondary | ICD-10-CM

## 2017-01-16 DIAGNOSIS — R410 Disorientation, unspecified: Secondary | ICD-10-CM | POA: Diagnosis not present

## 2017-01-16 LAB — URINALYSIS, COMPLETE
Bilirubin, UA: NEGATIVE
GLUCOSE, UA: NEGATIVE
KETONES UA: NEGATIVE
Nitrite, UA: POSITIVE — AB
PROTEIN UA: NEGATIVE
SPEC GRAV UA: 1.025 (ref 1.005–1.030)
UUROB: 0.2 mg/dL (ref 0.2–1.0)
pH, UA: 5 (ref 5.0–7.5)

## 2017-01-16 LAB — MICROSCOPIC EXAMINATION

## 2017-01-16 MED ORDER — NITROFURANTOIN MONOHYD MACRO 100 MG PO CAPS: 100.0000 mg | ORAL_CAPSULE | Freq: Two times a day (BID) | ORAL | 0 refills | Status: DC

## 2017-01-16 MED ORDER — CEFTRIAXONE SODIUM 1 G IJ SOLR
1.0000 g | Freq: Once | INTRAMUSCULAR | Status: AC
  Administered 2017-01-16: 1 g via INTRAMUSCULAR

## 2017-01-16 NOTE — Patient Instructions (Signed)
Urinary Frequency, Adult Urinary frequency means urinating more often than usual. People with urinary frequency urinate at least 8 times in 24 hours, even if they drink a normal amount of fluid. Although they urinate more often than normal, the total amount of urine produced in a day may be normal. Urinary frequency is also called pollakiuria. What are the causes? This condition may be caused by:  A urinary tract infection.  Obesity.  Bladder problems, such as bladder stones.  Caffeine or alcohol.  Eating food or drinking fluids that irritate the bladder. These include coffee, tea, soda, artificial sweeteners, citrus, tomato-based foods, and chocolate.  Certain medicines, such as medicines that help the body get rid of extra fluid (diuretics).  Muscle or nerve weakness.  Overactive bladder.  Chronic diabetes.  Interstitial cystitis.  In men, problems with the prostate, such as an enlarged prostate.  In women, pregnancy. In some cases, the cause may not be known. What increases the risk? This condition is more likely to develop in:  Women who have gone through menopause.  Men with prostate problems.  People with a disease or injury that affects the nerves or spinal cord.  People who have or have had a condition that affects the brain, such as a stroke. What are the signs or symptoms? Symptoms of this condition include:  Feeling an urgent need to urinate often. The stress and anxiety of needing to find a bathroom quickly can make this urge worse.  Urinating 8 or more times in 24 hours.  Urinating as often as every 1 to 2 hours. How is this diagnosed? This condition is diagnosed based on your symptoms, your medical history, and a physical exam. You may have tests, such as:  Blood tests.  Urine tests.  Imaging tests, such as X-rays or ultrasounds.  A bladder test.  A test of your neurological system. This is the body system that senses the need to urinate.  A  test to check for problems in the urethra and bladder called cystoscopy. You may also be asked to keep a bladder diary. A bladder diary is a record of what you eat and drink, how often you urinate, and how much you urinate. You may need to see a health care provider who specializes in conditions of the urinary tract (urologist) or kidneys (nephrologist). How is this treated? Treatment for this condition depends on the cause. Sometimes the condition goes away on its own and treatment is not necessary. If treatment is needed, it may include:  Taking medicine.  Learning exercises that strengthen the muscles that help control urination.  Following a bladder training program. This may include:  Learning to delay going to the bathroom.  Double urinating (voiding). This helps if you are not completely emptying your bladder.  Scheduled voiding.  Making diet changes, such as:  Avoiding caffeine.  Drinking fewer fluids, especially alcohol.  Not drinking in the evening.  Not having foods or drinks that may irritate the bladder.  Eating foods that help prevent or ease constipation. Constipation can make this condition worse.  Having the nerves in your bladder stimulated. There are two options for stimulating the nerves to your bladder:  Outpatient electrical nerve stimulation. This is done by your health care provider.  Surgery to implant a bladder pacemaker. The pacemaker helps to control the urge to urinate. Follow these instructions at home:  Keep a bladder diary if told to by your health care provider.  Take over-the-counter and prescription medicines only as   told by your health care provider.  Do any exercises as told by your health care provider.  Follow a bladder training program as told by your health care provider.  Make any recommended diet changes.  Keep all follow-up visits as told by your health care provider. This is important. Contact a health care provider  if:  You start urinating more often.  You feel pain or irritation when you urinate.  You notice blood in your urine.  Your urine looks cloudy.  You develop a fever.  You begin vomiting. Get help right away if:  You are unable to urinate. This information is not intended to replace advice given to you by your health care provider. Make sure you discuss any questions you have with your health care provider. Document Released: 07/22/2009 Document Revised: 10/27/2015 Document Reviewed: 04/21/2015 Elsevier Interactive Patient Education  2017 Elsevier Inc.  

## 2017-01-16 NOTE — Progress Notes (Signed)
   Subjective:    Patient ID: Katherine Roth, female    DOB: [birth year], 83 y.o.   MRN: [id number]  PT presents to the office today for recurrent confusion. PT has dementia, but seems to be worse. Pt is a resident at [facility] and left the building on Sunday evening and was "walking towards the road". Pt was stopped and brought back. PT is unable to answer questions and denies any pain at this time. Pt currently taking aricept 5 mg daily. Staff wonder if patient needs to be moved to the locked down unit at the facility.   Pt was admitted to 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  All other systems reviewed and are negative.      Objective:   Physical Exam  Constitutional: She appears well-developed and well-nourished. No distress.  HENT:  Head: Normocephalic.  Eyes: Pupils are equal, round, and reactive to light.  Neck: Normal range of motion. Neck supple. No thyromegaly present.  Cardiovascular: Normal rate, regular rhythm, normal heart sounds and intact distal pulses.   No murmur heard. Pulmonary/Chest: Effort normal and breath sounds normal. No respiratory distress. She has no wheezes.  Abdominal: Soft. Bowel sounds are normal. She exhibits no distension. There is no tenderness.  Musculoskeletal: Normal range of motion. She exhibits no edema or tenderness.  Neurological: She is disoriented.  Skin: Skin is warm and dry.  Psychiatric: She has a normal mood and affect. Her behavior is normal. Judgment and thought content normal.  Flat  Vitals reviewed.     BP 132/71   Pulse 60   Temp 98.2 F (36.8 C) (Oral)   Ht 5\' 3"  (1.6 m)   Wt 136 lb 3.2 oz (61.8 kg)   BMI 24.13 kg/m      Assessment & Plan:  1.  Confusion - Urinalysis, Complete - Urine culture - cefTRIAXone (ROCEPHIN) injection 1 g; Inject 1 g into the muscle once. - nitrofurantoin, macrocrystal-monohydrate, (MACROBID) 100 MG capsule; Take 1 capsule (100 mg total) by mouth 2 (two) times daily.  Dispense: 10 capsule; Refill: 0  2. Acute cystitis with hematuria Force fluids AZO over the counter X2 days RTO prn Culture pending - Urine culture - cefTRIAXone (ROCEPHIN) injection 1 g; Inject 1 g into the muscle once. - nitrofurantoin, macrocrystal-monohydrate, (MACROBID) 100 MG capsule; Take 1 capsule (100 mg total) by mouth 2 (two) times daily.  Dispense: 10 capsule; Refill: 0   Evelina Dun, FNP

## 2017-01-18 LAB — URINE CULTURE

## 2017-01-22 ENCOUNTER — Ambulatory Visit: Payer: Medicare Other | Admitting: Family

## 2017-01-23 ENCOUNTER — Ambulatory Visit: Payer: Medicare Other | Admitting: Family

## 2017-01-24 ENCOUNTER — Encounter: Payer: Self-pay | Admitting: Family

## 2017-01-24 ENCOUNTER — Other Ambulatory Visit: Payer: Self-pay | Admitting: Family

## 2017-01-24 ENCOUNTER — Ambulatory Visit (INDEPENDENT_AMBULATORY_CARE_PROVIDER_SITE_OTHER): Payer: Medicare Other | Admitting: Family

## 2017-01-24 VITALS — BP 154/68 | HR 61 | Temp 97.7°F | Ht 63.0 in | Wt 138.8 lb

## 2017-01-24 DIAGNOSIS — R413 Other amnesia: Secondary | ICD-10-CM

## 2017-01-24 DIAGNOSIS — N3001 Acute cystitis with hematuria: Secondary | ICD-10-CM | POA: Diagnosis not present

## 2017-01-24 LAB — URINALYSIS, COMPLETE
BILIRUBIN UA: NEGATIVE
GLUCOSE, UA: NEGATIVE
KETONES UA: NEGATIVE
NITRITE UA: NEGATIVE
PROTEIN UA: NEGATIVE
SPEC GRAV UA: 1.02 (ref 1.005–1.030)
Urobilinogen, Ur: 0.2 mg/dL (ref 0.2–1.0)
pH, UA: 5 (ref 5.0–7.5)

## 2017-01-24 LAB — MICROSCOPIC EXAMINATION
Bacteria, UA: NONE SEEN
Renal Epithel, UA: NONE SEEN /hpf

## 2017-01-24 NOTE — Addendum Note (Signed)
Addended by: Evelina Dun A on: 01/24/2017 02:13 PM   Modules accepted: Orders

## 2017-01-24 NOTE — Patient Instructions (Signed)
Alzheimer Disease Alzheimer disease is a brain disease that affects memory, thinking, and behavior. People with Alzheimer disease lose mental abilities, and the disease gets worse over time. Survival with Alzheimer disease ranges from several years to as long as 20 years. What are the causes? This condition develops when a protein called beta-amyloid forms deposits in the brain. It is not known what causes these deposits to form. What increases the risk? This condition is more likely to develop in people who:  Are elderly.  Have a family history of dementia.  Have had a brain injury.  Have heart or blood vessel disease.  Have had a stroke.  Have high blood pressure or high cholesterol.  Have diabetes. What are the signs or symptoms? Symptoms of this condition happen in three stages, which often overlap. Early stage In this stage, you may continue to be independent. You may still be able to drive, work, and be social. Symptoms in this stage include:  Minor memory problems, such as forgetting a name or what you read.  Difficulty with:  Paying attention.  Communicating.  Doing familiar tasks.  Learning new things.  Needing more time to do daily activities.  Anxiety.  Social withdrawal.  Loss of motivation. Moderate stage In this stage, you will start to need care. This stage usually lasts the longest. Symptoms in this stage include:  Difficulty with expressing thoughts.  Memory loss that affects daily life. This can include forgetting:  Your address or phone number.  Events that have happened.  Parts of your personal history, like where you went to school.  Confusion about where you are or what time it is.  Difficulty in judging distance.  Changes in personality, mood, and behavior. You may be moody, irritable, angry, frustrated, fearful, anxious, or suspicious.  Poor reasoning and judgment.  Delusions or hallucinations.  Changes in sleep  patterns.  Wandering and getting lost. Severe stage In the final stage, you will need help with your personal care and dailyactivities. Symptoms in this stage include:  Worsening memory loss.  Personality changes.  Loss of awareness of your surroundings.  Changes in physical abilities, including the ability to walk, sit, and swallow.  Difficulty in communicating.  Inability to control the bladder and bowels.  Increasing confusion.  Increasing disruptive behavior. How is this diagnosed? This condition is diagnosed with an assessment by your health care provider. During this assessment, your health care provider will talk with you and your family, friends, or caregivers about your symptoms. A thorough medical history will be taken, and you will have a physical exam and tests. Tests may include:  Lab tests, such as blood or urine tests.  Imaging tests, such as a CT scan, PET scan, or MRI.  A lumbar puncture. This test involves removing and testing a small amount of the fluid that surrounds the brain and spinal cord.  An electroencephalogram (EEG). In this test, small metal discs are used to measure electrical activity in the brain.  Memory tests, cognitive tests, and neuropsychological tests. These tests evaluate brain function. How is this treated? At this time, there is no treatment to cure Alzheimer disease or stop it from getting worse. The goals of treatment are:  To slow down the disease.  To manage behavioral problems.  To provide you with a safe environment.  To make life easier for you and your caregivers. The following treatment options are available:  Medicines. Medicines may help to slow down memory loss and control behavioral symptoms.    Talk therapy. Talk therapy provides you with education, support, and memory aids. It is most helpful in the early stages of the condition.  Counseling or spiritual guidance. It is normal to have a lot of feelings, including  anger, relief, fear, and isolation. Counseling and guidance can help you deal with these feelings.  Caregiving. This involves having caregivers help you with your daily activities. Caregivers may be family members, friends, or trained medical professionals. Caregiving can be done at home or outside the home.  Family support groups. These provide education, emotional support, and information about community resources to family members who are taking care of you. Follow these instructions at home: Medicines  Take over-the-counter and prescription medicines only as told by your health care provider.  Avoid taking medicines that can affect thinking, such as pain or sleeping medicines. Lifestyle  Make healthy lifestyle choices:  Be physically active as told by your health care provider.  Do not use any tobacco products, such as cigarettes, chewing tobacco, and e-cigarettes. If you need help quitting, ask your health care provider.  Eat a healthy diet.  Practice stress-management techniques when you get stressed.  Stay social.  Drink enough fluid to keep your urine clear or pale yellow.  Make sure to get quality sleep. These tips can help you get a good night's rest:  Avoid napping during the day.  Keep your sleeping area dark and cool.  Avoid exercising during the few hours before you go to bed.  Avoid caffeine products in the evening. General instructions  Work with your health care provider to determine what you need help with and what your safety needs are.  If you were given a bracelet that tracks your location, make sure to wear it.  Keep all follow-up visits as told by your health care provider. This is important.  If you have questions or would like additional support, you may contact The Alzheimer's [employer]:  24-hour helpline: 1-800-272-3900  Website: www.alz.org Contact a health care provider if:  You have nausea, vomiting, or trouble with eating.  You  have dizziness, or weakness.  You have new or worsening trouble with sleeping.  You or your family members become concerned for your safety. Get help right away if:  You develop chest pain or difficulty with breathing.  You pass out. This information is not intended to replace advice given to you by your health care provider. Make sure you discuss any questions you have with your health care provider. Document Released: 06/06/2004 Document Revised: 05/26/2016 Document Reviewed: 06/23/2015 Elsevier Interactive Patient Education  2017 Elsevier Inc.  

## 2017-01-24 NOTE — Progress Notes (Signed)
   Subjective:    Patient ID: Katherine Roth, female    DOB: [birth year], 83 y.o.   MRN: [id number]   HPI PT presents to the office today to recheck UTI. PT was diagnosed with UTI on 01/16/17. Pt is a resident at [facility]. PT has Dementia and when she had the UTI she had left the building and was walking towards the road. Someone saw patient and she was brought back into the building, but the office manager questions if patient should be moved to the memory center of the center. Pt's aid reports her confusion has improved and she is "more at her baseline".    Review of Systems  Unable to perform ROS: Dementia       Objective:   Physical Exam  Constitutional: She appears well-developed and well-nourished. No distress.  HENT:  Head: Normocephalic.  Cardiovascular: Normal rate, regular rhythm, normal heart sounds and intact distal pulses.   No murmur heard. Pulmonary/Chest: Effort normal and breath sounds normal. No respiratory distress. She has no wheezes.  Abdominal: Soft. Bowel sounds are normal. She exhibits no distension. There is no tenderness.  Musculoskeletal: Normal range of motion. She exhibits no edema or tenderness.  Neurological: She is alert. She is disoriented.  Skin: Skin is warm and dry.  Psychiatric: She has a normal mood and affect. Her behavior is normal. Judgment and thought content normal.  Flat affect  Vitals reviewed.     BP (!) 154/68   Pulse 61   Temp 97.7 F (36.5 C) (Oral)   Ht 5\' 3"  (1.6 m)   Wt 138 lb 12.8 oz (63 kg)   BMI 24.59 kg/m      Assessment & Plan:  1. Memory deficit -Order written for pt to be moved to the memory center of [facility]  2. Acute cystitis with hematuria Resolved Force fluids RTO prn and keep chronic follow up appt - Urinalysis, Complete  Evelina Dun, FNP

## 2017-01-26 LAB — URINE CULTURE

## 2017-01-29 ENCOUNTER — Other Ambulatory Visit: Payer: Self-pay | Admitting: Family

## 2017-01-29 MED ORDER — SULFAMETHOXAZOLE-TRIMETHOPRIM 800-160 MG PO TABS: 1.0000 | ORAL_TABLET | Freq: Two times a day (BID) | ORAL | 0 refills | Status: DC

## 2017-03-01 ENCOUNTER — Telehealth: Payer: Self-pay | Admitting: Family

## 2017-03-01 NOTE — Telephone Encounter (Signed)
Apt made 5/29 with [facility]. [facility] will call back if they can bring her in sooner.

## 2017-03-01 NOTE — Telephone Encounter (Signed)
Pt needs to be seen. I will out of office tomorrow.

## 2017-03-06 ENCOUNTER — Ambulatory Visit (INDEPENDENT_AMBULATORY_CARE_PROVIDER_SITE_OTHER): Payer: Medicare Other | Admitting: Family

## 2017-03-06 ENCOUNTER — Encounter: Payer: Self-pay | Admitting: Family

## 2017-03-06 VITALS — BP 138/59 | HR 67 | Temp 96.8°F

## 2017-03-06 DIAGNOSIS — L8993 Pressure ulcer of unspecified site, stage 3: Secondary | ICD-10-CM

## 2017-03-06 DIAGNOSIS — R410 Disorientation, unspecified: Secondary | ICD-10-CM

## 2017-03-06 DIAGNOSIS — Z87440 Personal history of urinary (tract) infections: Secondary | ICD-10-CM | POA: Diagnosis not present

## 2017-03-06 LAB — MICROSCOPIC EXAMINATION: RENAL EPITHEL UA: NONE SEEN /HPF

## 2017-03-06 LAB — URINALYSIS, COMPLETE
Bilirubin, UA: NEGATIVE
Glucose, UA: NEGATIVE
Ketones, UA: NEGATIVE
LEUKOCYTES UA: NEGATIVE
NITRITE UA: NEGATIVE
PH UA: 6 (ref 5.0–7.5)
Protein, UA: NEGATIVE
SPEC GRAV UA: 1.025 (ref 1.005–1.030)

## 2017-03-06 NOTE — Progress Notes (Signed)
   Subjective:    Patient ID: Katherine Roth, female    DOB: 10-13-1932, 83 y.o.   MRN: [id number]    HPI Pt presents to the office today with recurrent UTI. Pt has end stage dementia and was moved the the [facility] at [place]. The nurse states the patient has a pressure ulcer on sacral area that is worse.  PT's urine is dark and her dementia is worse.    Review of Systems  Unable to perform ROS: Dementia       Objective:   Physical Exam  Constitutional: She is oriented to person, place, and time. She appears well-developed and well-nourished. No distress.  HENT:  Head: Normocephalic.  Eyes: Pupils are equal, round, and reactive to light.  Neck: Normal range of motion. Neck supple. No thyromegaly present.  Cardiovascular: Normal rate, regular rhythm, normal heart sounds and intact distal pulses.   No murmur heard. Pulmonary/Chest: Effort normal and breath sounds normal. No respiratory distress. She has no wheezes.  Abdominal: Soft. Bowel sounds are normal. She exhibits no distension. There is no tenderness.  Musculoskeletal: Normal range of motion. She exhibits no edema or tenderness.  Neurological: She is alert and oriented to person, place, and time.  Skin: Skin is warm and dry. Lesion noted.  Stage 3 pressure ulcer on sacral, 2CmX1.5 and 2X1CM   Psychiatric: She is withdrawn. Cognition and memory are impaired. She expresses inappropriate judgment. She is noncommunicative. She exhibits abnormal recent memory and abnormal remote memory.  Flat  Vitals reviewed.           Assessment & Plan:  1. Confusion - Urinalysis, Complete - Urine culture  2. Hx: UTI (urinary tract infection)  3. Stage 3 pressure ulcer with suspected deep tissue injury ([place]) Change mepilex every 3 days Keep clean and dry Change positions every 2 hours!!  Urine culture pending Force fluids Urine negative today  Evelina Dun, FNP

## 2017-03-06 NOTE — Patient Instructions (Signed)
Pressure Injury A pressure injury, sometimes called a bedsore, is an injury to the skin and underlying tissue caused by pressure. Pressure on blood vessels causes decreased blood flow to the skin, which can eventually cause the skin tissue to die and break down into a wound. Pressure injuries usually occur:  Over bony parts of the body such as the tailbone, shoulders, elbows, hips, and heels.  Under medical devices such as respiratory equipment, stockings, tubes, and splints. Pressure injuries start as reddened areas on the skin and can lead to pain, muscle damage, and infection. Pressure injuries can vary in severity. What are the causes? Pressure injuries are caused by a lack of blood supply to an area of skin. They can occur from intense pressure over a short period of time or from less intense pressure over a long period of time. What increases the risk? This condition is more likely to develop in people who:  Are in the hospital or an extended care facility.  Are bedridden or in a wheelchair.  Have an injury or disease that keeps them from:  Moving normally.  Feeling pain or pressure.  Have a condition that:  Makes them sleepy or less alert.  Causes poor blood flow.  Need to wear a medical device.  Have poor control of their bladder or bowel functions (incontinence).  Have poor nutrition (malnutrition).  Are of certain ethnicities. People of African American and Latino or Hispanic descent are at higher risk compared to other ethnic groups. If you are at risk for pressure ulcers, your health care provider may recommend certain types of bedding to help prevent them. These may include foam or gel mattresses covered with one of the following:  A sheepskin blanket.  A pad that is filled with gel, air, water, or foam. What are the signs or symptoms? The main symptom is a blister or change in skin color that opens into a wound. Other symptoms include:  Red or dark areas of  skin that do not turn white or pale when pressed with a finger.  Pain, warmth, or change of skin texture. How is this diagnosed? This condition is diagnosed with a medical history and physical exam. You may also have tests, including:  Blood tests to check for infection or signs of poor nutrition.  Imaging studies to check for damage to the deep tissues under your skin.  Blood flow studies. Your pressure injury will be staged to determine its severity. Staging is an assessment of:  The depth of the pressure injury.  Which tissues are exposed because of the pressure injury.  The causes of the pressure injury. How is this treated? The main focus of treatment is to help your injury heal. This may be done by:  Relieving or redistributing pressure on your skin. This includes:  Frequently changing your position.  Eliminating or minimizing positions that caused the wound or that can make the wound worse.  Using specific bed mattresses and chair cushions.  Refitting, resizing, or replacing any medical devices, or padding the skin under them.  Using creams or powders to prevent rubbing (friction) on the skin.  Keeping your skin clean and dry. This may include using a skin cleanser or skin protectant as told by your health care provider. This may be a lotion, ointment, or spray.  Cleaning your injury and removing any dead tissue from the wound (debridement).  Placing a bandage (dressing) over your injury.  Preventing or treating infection. This may include antibiotic, antimicrobial, or  antiseptic medicines. Treatment may also include medicine for pain. Sometimes surgery is needed to close the wound with a flap of healthy skin or a piece of skin from another area of your body (graft). You may need surgery if other treatments are not working or if your injury is very deep. Follow these instructions at home: Wound care   Follow instructions from your health care provider about:  How  to take care of your wound.  When and how you should change your dressing.  When you should remove your dressing. If your dressing is dry and stuck when you try to remove it, moisten or wet the dressing with saline or water so that it can be removed without harming your skin or wound tissue.  Check your wound every day for signs of infection. Have a caregiver do this for you if you are not able. Watch for:  More redness, swelling, or pain.  More fluid, blood, or pus.  A bad smell. Skin Care   Keep your skin clean and dry. Gently pat your skin dry.  Do not rub or massage your skin.  Use a skin protectant only as told by your health care provider.  Check your skin every day for any changes in color or any new blisters or sores (ulcers). Have a caregiver do this for you if you are not able. Medicines   Take over-the-counter and prescription medicines only as told by your health care provider.  If you were prescribed an antibiotic medicine, take it or apply it as told by your health care provider. Do not stop taking or using the antibiotic even if your condition improves. Reducing and Redistributing Pressure   Do not lie or sit in one position for a long time. Move or change position every two hours or as told by your health care provider.  Use pillows or cushions to reduce pressure. Ask your health care provider to recommend cushions or pads for you.  Use medical devices that do not rub your skin. Tell your health care provider if one of your medical devices is causing a pressure injury to develop. General instructions    Eat a healthy diet that includes lots of protein. Ask your health care provider for diet advice.  Drink enough fluid to keep your urine clear or pale yellow.  Be as active as you can every day. Ask your health care provider to suggest safe exercises or activities.  Do not abuse drugs or alcohol.  Keep all follow-up visits as told by your health care  provider. This is important.  Do not smoke. Contact a health care provider if:   You have chills or fever.  Your pain medicine is not helping.  You have any changes in skin color.  You have new blisters or sores.  You develop warmth, redness, or swelling near a pressure injury.  You have a bad odor or pus coming from your pressure injury.  You lose control of your bowels or bladder.  You develop new symptoms.  Your wound does not improve after 1-2 weeks of treatment.  You develop a new medical condition, such as diabetes, peripheral vascular disease, or conditions that affect your defense (immune) system. This information is not intended to replace advice given to you by your health care provider. Make sure you discuss any questions you have with your health care provider. Document Released: 09/25/2005 Document Revised: 02/28/2016 Document Reviewed: 02/03/2015 Elsevier Interactive Patient Education  2017 [employer].

## 2017-03-08 ENCOUNTER — Other Ambulatory Visit: Payer: Self-pay | Admitting: Family

## 2017-03-08 LAB — URINE CULTURE

## 2017-03-08 MED ORDER — CIPROFLOXACIN HCL 500 MG PO TABS: 500.0000 mg | ORAL_TABLET | Freq: Two times a day (BID) | ORAL | 0 refills | Status: DC

## 2017-03-22 ENCOUNTER — Encounter: Payer: Self-pay | Admitting: Family

## 2017-03-22 ENCOUNTER — Ambulatory Visit (INDEPENDENT_AMBULATORY_CARE_PROVIDER_SITE_OTHER): Payer: Medicare Other | Admitting: Family

## 2017-03-22 ENCOUNTER — Ambulatory Visit: Payer: Medicare Other | Admitting: Family

## 2017-03-22 VITALS — BP 137/70 | HR 60 | Temp 98.0°F | Ht 63.0 in

## 2017-03-22 DIAGNOSIS — N3000 Acute cystitis without hematuria: Secondary | ICD-10-CM

## 2017-03-22 DIAGNOSIS — Z87440 Personal history of urinary (tract) infections: Secondary | ICD-10-CM

## 2017-03-22 DIAGNOSIS — L89153 Pressure ulcer of sacral region, stage 3: Secondary | ICD-10-CM | POA: Diagnosis not present

## 2017-03-22 LAB — URINALYSIS
BILIRUBIN UA: NEGATIVE
GLUCOSE, UA: NEGATIVE
KETONES UA: NEGATIVE
Nitrite, UA: NEGATIVE
Protein, UA: NEGATIVE
RBC, UA: NEGATIVE
SPEC GRAV UA: 1.02 (ref 1.005–1.030)
Urobilinogen, Ur: 2 mg/dL — ABNORMAL HIGH (ref 0.2–1.0)
pH, UA: 6.5 (ref 5.0–7.5)

## 2017-03-22 MED ORDER — CEFTRIAXONE SODIUM 1 G IJ SOLR
1.0000 g | Freq: Once | INTRAMUSCULAR | Status: AC
  Administered 2017-03-22: 1 g via INTRAMUSCULAR

## 2017-03-22 MED ORDER — AMOXICILLIN-POT CLAVULANATE 875-125 MG PO TABS: 1.0000 | ORAL_TABLET | Freq: Two times a day (BID) | ORAL | 0 refills | Status: DC

## 2017-03-22 NOTE — Progress Notes (Signed)
   Subjective:    Patient ID: Katherine Roth, female    DOB: Jun 26, 1933, 83 y.o.   MRN: [id number]  HPI PT presents to the office today to recheck her urine for recurrent UTI. PT has end-stage dementia and is unable to commuicate with [place]. PT is a resident at [facility].   PT has a stage 3 pressure ulcer on sacral area. Pt's caregiver today is unsure if they are applying the mepliex every 3 days to wound, but states "it should be if ordered." No dressing present today.    Review of Systems  Unable to perform ROS: Dementia       Objective:   Physical Exam  Constitutional: She appears well-developed and well-nourished.  disoriented   Cardiovascular: Normal rate and normal heart sounds.   Pulmonary/Chest: Effort normal and breath sounds normal.  Musculoskeletal:  Generalized weakness, pt in wheelchair  Skin:  Stage 3 sacral wound,   Psychiatric: She is withdrawn. Cognition and memory are impaired. She is noncommunicative.        BP 137/70   Pulse 60   Temp 98 F (36.7 C) (Oral)   Ht 5\' 3"  (1.6 m)      Assessment & Plan:   1. Pressure ulcer of sacral region, stage 3 (HCC) -Apply mepilex every 3 days!! Change positions every 2 hours  2. Hx of bladder infections - Urine Culture - Urinalysis  3. Acute cystitis without hematuria Force fluids RTO 2 weeks Culture pending - cefTRIAXone (ROCEPHIN) injection 1 g; Inject 1 g into the muscle once. - amoxicillin-clavulanate (AUGMENTIN) 875-125 MG tablet; Take 1 tablet by mouth 2 (two) times daily.  Dispense: 14 tablet; Refill: 0  Evelina Dun, FNP

## 2017-03-22 NOTE — Patient Instructions (Signed)
Pressure Injury A pressure injury, sometimes called a bedsore, is an injury to the skin and underlying tissue caused by pressure. Pressure on blood vessels causes decreased blood flow to the skin, which can eventually cause the skin tissue to die and break down into a wound. Pressure injuries usually occur:  Over bony parts of the body such as the tailbone, shoulders, elbows, hips, and heels.  Under medical devices such as respiratory equipment, stockings, tubes, and splints.  Pressure injuries start as reddened areas on the skin and can lead to pain, muscle damage, and infection. Pressure injuries can vary in severity. What are the causes? Pressure injuries are caused by a lack of blood supply to an area of skin. They can occur from intense pressure over a short period of time or from less intense pressure over a long period of time. What increases the risk? This condition is more likely to develop in people who:  Are in the hospital or an extended care facility.  Are bedridden or in a wheelchair.  Have an injury or disease that keeps them from: ? Moving normally. ? Feeling pain or pressure.  Have a condition that: ? Makes them sleepy or less alert. ? Causes poor blood flow.  Need to wear a medical device.  Have poor control of their bladder or bowel functions (incontinence).  Have poor nutrition (malnutrition).  Are of certain ethnicities. People of African American and Latino or Hispanic descent are at higher risk compared to other ethnic groups.  If you are at risk for pressure ulcers, your health care provider may recommend certain types of bedding to help prevent them. These may include foam or gel mattresses covered with one of the following:  A sheepskin blanket.  A pad that is filled with gel, air, water, or foam.  What are the signs or symptoms? The main symptom is a blister or change in skin color that opens into a wound. Other symptoms include:  Red or dark  areas of skin that do not turn white or pale when pressed with a finger.  Pain, warmth, or change of skin texture.  How is this diagnosed? This condition is diagnosed with a medical history and physical exam. You may also have tests, including:  Blood tests to check for infection or signs of poor nutrition.  Imaging studies to check for damage to the deep tissues under your skin.  Blood flow studies.  Your pressure injury will be staged to determine its severity. Staging is an assessment of:  The depth of the pressure injury.  Which tissues are exposed because of the pressure injury.  The causes of the pressure injury.  How is this treated? The main focus of treatment is to help your injury heal. This may be done by:  Relieving or redistributing pressure on your skin. This includes: ? Frequently changing your position. ? Eliminating or minimizing positions that caused the wound or that can make the wound worse. ? Using specific bed mattresses and chair cushions. ? Refitting, resizing, or replacing any medical devices, or padding the skin under them. ? Using creams or powders to prevent rubbing (friction) on the skin.  Keeping your skin clean and dry. This may include using a skin cleanser or skin protectant as told by your health care provider. This may be a lotion, ointment, or spray.  Cleaning your injury and removing any dead tissue from the wound (debridement).  Placing a bandage (dressing) over your injury.  Preventing or treating infection.  This may include antibiotic, antimicrobial, or antiseptic medicines.  Treatment may also include medicine for pain. Sometimes surgery is needed to close the wound with a flap of healthy skin or a piece of skin from another area of your body (graft). You may need surgery if other treatments are not working or if your injury is very deep. Follow these instructions at home: Wound care  Follow instructions from your health care  provider about: ? How to take care of your wound. ? When and how you should change your dressing. ? When you should remove your dressing. If your dressing is dry and stuck when you try to remove it, moisten or wet the dressing with saline or water so that it can be removed without harming your skin or wound tissue.  Check your wound every day for signs of infection. Have a caregiver do this for you if you are not able. Watch for: ? More redness, swelling, or pain. ? More fluid, blood, or pus. ? A bad smell. Skin Care  Keep your skin clean and dry. Gently pat your skin dry.  Do not rub or massage your skin.  Use a skin protectant only as told by your health care provider.  Check your skin every day for any changes in color or any new blisters or sores (ulcers). Have a caregiver do this for you if you are not able. Medicines  Take over-the-counter and prescription medicines only as told by your health care provider.  If you were prescribed an antibiotic medicine, take it or apply it as told by your health care provider. Do not stop taking or using the antibiotic even if your condition improves. Reducing and Redistributing Pressure  Do not lie or sit in one position for a long time. Move or change position every two hours or as told by your health care provider.  Use pillows or cushions to reduce pressure. Ask your health care provider to recommend cushions or pads for you.  Use medical devices that do not rub your skin. Tell your health care provider if one of your medical devices is causing a pressure injury to develop. General instructions   Eat a healthy diet that includes lots of protein. Ask your health care provider for diet advice.  Drink enough fluid to keep your urine clear or pale yellow.  Be as active as you can every day. Ask your health care provider to suggest safe exercises or activities.  Do not abuse drugs or alcohol.  Keep all follow-up visits as told by your  health care provider. This is important.  Do not smoke. Contact a health care provider if:   You have chills or fever.  Your pain medicine is not helping.  You have any changes in skin color.  You have new blisters or sores.  You develop warmth, redness, or swelling near a pressure injury.  You have a bad odor or pus coming from your pressure injury.  You lose control of your bowels or bladder.  You develop new symptoms.  Your wound does not improve after 1-2 weeks of treatment.  You develop a new medical condition, such as diabetes, peripheral vascular disease, or conditions that affect your defense (immune) system. This information is not intended to replace advice given to you by your health care provider. Make sure you discuss any questions you have with your health care provider. Document Released: 09/25/2005 Document Revised: 02/28/2016 Document Reviewed: 02/03/2015 Elsevier Interactive Patient Education  2018 [employer]  

## 2017-03-24 LAB — URINE CULTURE

## 2017-04-05 ENCOUNTER — Ambulatory Visit (INDEPENDENT_AMBULATORY_CARE_PROVIDER_SITE_OTHER): Payer: Medicare Other | Admitting: Family

## 2017-04-05 ENCOUNTER — Encounter: Payer: Self-pay | Admitting: Family

## 2017-04-05 VITALS — BP 114/54 | HR 63 | Temp 98.6°F

## 2017-04-05 DIAGNOSIS — N390 Urinary tract infection, site not specified: Secondary | ICD-10-CM | POA: Diagnosis not present

## 2017-04-05 DIAGNOSIS — L89153 Pressure ulcer of sacral region, stage 3: Secondary | ICD-10-CM | POA: Diagnosis not present

## 2017-04-05 MED ORDER — NITROFURANTOIN MACROCRYSTAL 50 MG PO CAPS: 50.0000 mg | ORAL_CAPSULE | Freq: Every day | ORAL | 1 refills | Status: AC

## 2017-04-05 NOTE — Progress Notes (Signed)
   Subjective:    Patient ID: Katherine Roth, female    DOB: [birth year], 83 y.o.   MRN: [id number]  HPI Pt presents to the office today to recheck urine and pressure ulcer. Pt is a resident of [facility] and has alzheimer's. PT does not communicate, but since her last antibiotics her confusion has seemed slightly better per nursing staff.    Review of Systems  Unable to perform ROS: Dementia       Objective:   Physical Exam  Constitutional: She is oriented to person, place, and time. She appears well-developed.  Cardiovascular: Normal rate and normal heart sounds.   Pulmonary/Chest: Effort normal and breath sounds normal.  Neurological: She is alert and oriented to person, place, and time.  Skin:  Pressure ulcer present on sacral region apprx 0.3X1cm and  09X1.3  Psychiatric: She is withdrawn. Cognition and memory are impaired. She expresses inappropriate judgment. She is noncommunicative. She exhibits abnormal recent memory and abnormal remote memory.        BP (!) 114/54   Pulse 63   Temp 98.6 F (37 C) (Oral)      Assessment & Plan:  1. Recurrent UTI Staff to bring back urine sample - nitrofurantoin (MACRODANTIN) 50 MG capsule; Take 1 capsule (50 mg total) by mouth daily.  Dispense: 90 capsule; Refill: 1 - Urinalysis, Complete - Urine Culture  2. Pressure ulcer of sacral region, stage 3 (HCC) Continue dressing changes every 3 days Keep clean and dry Position changes every 2 hours   Evelina Dun, FNP

## 2017-04-05 NOTE — Patient Instructions (Signed)

## 2017-09-21 ENCOUNTER — Ambulatory Visit: Payer: Medicare Other | Admitting: Family

## 2017-09-25 ENCOUNTER — Encounter: Payer: Self-pay | Admitting: Family

## 2017-09-25 ENCOUNTER — Ambulatory Visit (INDEPENDENT_AMBULATORY_CARE_PROVIDER_SITE_OTHER): Payer: Medicare Other | Admitting: Family

## 2017-09-25 VITALS — BP 134/68 | HR 55 | Temp 97.2°F | Ht 63.0 in | Wt 127.4 lb

## 2017-09-25 DIAGNOSIS — R410 Disorientation, unspecified: Secondary | ICD-10-CM | POA: Diagnosis not present

## 2017-09-25 DIAGNOSIS — N3001 Acute cystitis with hematuria: Secondary | ICD-10-CM | POA: Diagnosis not present

## 2017-09-25 LAB — MICROSCOPIC EXAMINATION: Epithelial Cells (non renal): >10 /hpf — AB (ref 0–10)

## 2017-09-25 LAB — URINALYSIS, COMPLETE
Bilirubin, UA: NEGATIVE
Glucose, UA: NEGATIVE
KETONES UA: NEGATIVE
NITRITE UA: POSITIVE — AB
PH UA: 5.5 (ref 5.0–7.5)
Specific Gravity, UA: 1.025 (ref 1.005–1.030)
Urobilinogen, Ur: 0.2 mg/dL (ref 0.2–1.0)

## 2017-09-25 MED ORDER — CEFTRIAXONE SODIUM 1 G IJ SOLR
1.0000 g | Freq: Once | INTRAMUSCULAR | Status: AC
  Administered 2017-09-25: 1 g via INTRAMUSCULAR

## 2017-09-25 MED ORDER — CIPROFLOXACIN HCL 500 MG PO TABS: 500.0000 mg | ORAL_TABLET | Freq: Two times a day (BID) | ORAL | 0 refills | Status: DC

## 2017-09-25 NOTE — Patient Instructions (Signed)

## 2017-09-25 NOTE — Progress Notes (Signed)
   Subjective:    Patient ID: Katherine Roth, female    DOB: [birth year], 83 y.o.   MRN: [id number]  HPI PT is brought in by [facility] today.  Pt has had increased confusion and alter mental status over the last few weeks that has become worse. Pt states she is not having any dysura, but has dementia.     Review of Systems  Unable to perform ROS: Dementia       Objective:   Physical Exam  Constitutional: She is oriented to person, place, and time. She appears well-developed and well-nourished. No distress.  HENT:  Head: Normocephalic.  Eyes: Pupils are equal, round, and reactive to light.  Neck: Normal range of motion. Neck supple. No thyromegaly present.  Cardiovascular: Normal rate, regular rhythm, normal heart sounds and intact distal pulses.  No murmur heard. Pulmonary/Chest: Effort normal and breath sounds normal. No respiratory distress. She has no wheezes.  Abdominal: Soft. Bowel sounds are normal. She exhibits no distension. There is no tenderness.  Musculoskeletal: Normal range of motion. She exhibits no edema or tenderness.  Neurological: She is oriented to person, place, and time.  Skin: Skin is warm and dry.  Psychiatric: She has a normal mood and affect. She is withdrawn. Cognition and memory are impaired. She is noncommunicative. She exhibits abnormal recent memory and abnormal remote memory.  Vitals reviewed.     BP 134/68   Pulse (!) 55   Temp (!) 97.2 F (36.2 C) (Oral)   Ht '5\' 3"'$  (1.6 m)   Wt 127 lb 6.4 oz (57.8 kg)   BMI 22.57 kg/m      Assessment & Plan:  1. Confusion - Urinalysis, Complete - BMP8+EGFR  2. Acute cystitis with hematuria Force fluids AZO over the counter X2 days RTO prn Culture pending - BMP8+EGFR - Urine Culture - cefTRIAXone (ROCEPHIN) injection 1 g - ciprofloxacin (CIPRO) 500 MG tablet; Take 1 tablet (500 mg total) by mouth 2 (two) times daily.  Dispense: 14 tablet; Refill: 0   Evelina Dun, FNP

## 2017-09-26 LAB — BMP8+EGFR
BUN / CREAT RATIO: 12 (ref 12–28)
BUN: 12 mg/dL (ref 8–27)
CO2: 28 mmol/L (ref 20–29)
Calcium: 9.4 mg/dL (ref 8.7–10.3)
Chloride: 101 mmol/L (ref 96–106)
Creatinine, Ser: 0.99 mg/dL (ref 0.57–1.00)
GFR calc non Af Amer: 53 mL/min/{1.73_m2} — ABNORMAL LOW (ref >59)
GFR, EST AFRICAN AMERICAN: 61 mL/min/{1.73_m2} (ref >59)
Glucose: 106 mg/dL — ABNORMAL HIGH (ref 65–99)
POTASSIUM: 4.1 mmol/L (ref 3.5–5.2)
SODIUM: 143 mmol/L (ref 134–144)

## 2017-09-28 ENCOUNTER — Other Ambulatory Visit: Payer: Self-pay | Admitting: Family

## 2017-09-28 LAB — URINE CULTURE

## 2017-09-28 MED ORDER — AMOXICILLIN-POT CLAVULANATE 875-125 MG PO TABS: 1.0000 | ORAL_TABLET | Freq: Two times a day (BID) | ORAL | 0 refills | Status: DC

## 2017-10-29 ENCOUNTER — Encounter: Payer: Self-pay | Admitting: Family

## 2017-10-29 ENCOUNTER — Ambulatory Visit (INDEPENDENT_AMBULATORY_CARE_PROVIDER_SITE_OTHER): Payer: Medicare Other | Admitting: Family

## 2017-10-29 VITALS — BP 150/63 | HR 53 | Temp 96.6°F | Ht 63.0 in | Wt 126.0 lb

## 2017-10-29 DIAGNOSIS — Z87440 Personal history of urinary (tract) infections: Secondary | ICD-10-CM

## 2017-10-29 DIAGNOSIS — I10 Essential (primary) hypertension: Secondary | ICD-10-CM | POA: Diagnosis not present

## 2017-10-29 DIAGNOSIS — R413 Other amnesia: Secondary | ICD-10-CM

## 2017-10-29 DIAGNOSIS — G20 Parkinson's disease: Secondary | ICD-10-CM

## 2017-10-29 DIAGNOSIS — Z23 Encounter for immunization: Secondary | ICD-10-CM

## 2017-10-29 DIAGNOSIS — E034 Atrophy of thyroid (acquired): Secondary | ICD-10-CM | POA: Diagnosis not present

## 2017-10-29 DIAGNOSIS — N3001 Acute cystitis with hematuria: Secondary | ICD-10-CM | POA: Diagnosis not present

## 2017-10-29 DIAGNOSIS — E785 Hyperlipidemia, unspecified: Secondary | ICD-10-CM

## 2017-10-29 NOTE — Addendum Note (Signed)
Addended by: Shelbie Ammons on: 10/29/2017 11:22 AM   Modules accepted: Orders

## 2017-10-29 NOTE — Patient Instructions (Signed)

## 2017-10-29 NOTE — Progress Notes (Addendum)
   Subjective:    Patient ID: Katherine Roth, female    DOB: [birth year], 84 y.o.   MRN: [id number]  Pt presents to the office today for follow up. PT is a resident at [facility] and has dementia. She  has chronic UTI's and takes nitrofurantoin 50 mg daily. PT denies any UTI symptoms.  Hypertension  This is a chronic problem. The current episode started more than 1 year ago. The problem has been resolved since onset. The problem is uncontrolled. Associated symptoms include malaise/fatigue. Pertinent negatives include no peripheral edema or shortness of breath. Risk factors for coronary artery disease include dyslipidemia. The current treatment provides moderate improvement. There is no history of kidney disease or CAD/MI. Identifiable causes of hypertension include a thyroid problem.  Thyroid Problem  Presents for follow-up visit. Patient reports no constipation, diarrhea, fatigue or visual change. The symptoms have been stable. Her past medical history is significant for hyperlipidemia.  Hyperlipidemia  This is a chronic problem. The current episode started more than 1 year ago. The problem is uncontrolled. Recent lipid tests were reviewed and are high. Pertinent negatives include no shortness of breath. Current antihyperlipidemic treatment includes statins. The current treatment provides moderate improvement of lipids.      Review of Systems  Unable to perform ROS: Dementia  Constitutional: Positive for malaise/fatigue. Negative for fatigue.  Respiratory: Negative for shortness of breath.   Gastrointestinal: Negative for constipation and diarrhea.  All other systems reviewed and are negative.      Objective:   Physical Exam  Constitutional: She is oriented to person, place, and time. She appears well-developed and well-nourished. No distress.  HENT:  Head: Normocephalic and atraumatic.  Right Ear: External ear normal.  Left Ear: External ear normal.  Nose: Nose normal.    Mouth/Throat: Oropharynx is clear and moist.  Eyes: Pupils are equal, round, and reactive to light.  Cardiovascular: Normal rate, regular rhythm, normal heart sounds and intact distal pulses.  No murmur heard. Pulmonary/Chest: Effort normal. No respiratory distress. She has decreased breath sounds. She has no wheezes.  Abdominal: Soft. Bowel sounds are normal. She exhibits no distension. There is no tenderness.  Musculoskeletal: Normal range of motion. She exhibits no edema or tenderness.  Simple tremor present   Neurological: She is alert and oriented to person, place, and time.  Skin: Skin is warm and dry.  Psychiatric: She has a normal mood and affect. Judgment and thought content normal. She is withdrawn.  Vitals reviewed.   BP (!) 150/63   Pulse (!) 53   Temp (!) 96.6 F (35.9 C) (Oral)   Ht '5\' 3"'$  (1.6 m)   Wt 126 lb (57.2 kg)   BMI 22.32 kg/m      Assessment & Plan:  1. Essential (primary) hypertension - CMP14+EGFR  2. Hypothyroidism due to acquired atrophy of thyroid - CMP14+EGFR - TSH  3. Primary Parkinsonism (HCC) - CMP14+EGFR  4. Hyperlipidemia, unspecified hyperlipidemia type - CMP14+EGFR - Lipid panel  5. Memory deficit - CMP14+EGFR  6. Hx: UTI (urinary tract infection) - CMP14+EGFR - Urine Culture - Urinalysis, Complete  7. History of recurrent UTIs - CMP14+EGFR - Urine Culture - Urinalysis, Complete   Continue all meds Labs pending Health Maintenance reviewed Diet and exercise encouraged RTO 6 months   Evelina Dun, FNP

## 2017-10-30 ENCOUNTER — Other Ambulatory Visit: Payer: Self-pay | Admitting: Family

## 2017-10-30 ENCOUNTER — Other Ambulatory Visit: Payer: Self-pay | Admitting: *Deleted

## 2017-10-30 DIAGNOSIS — E034 Atrophy of thyroid (acquired): Secondary | ICD-10-CM

## 2017-10-30 LAB — CMP14+EGFR
A/G RATIO: 1.2 (ref 1.2–2.2)
ALT: 6 IU/L (ref 0–32)
AST: 13 IU/L (ref 0–40)
Albumin: 4 g/dL (ref 3.5–4.7)
Alkaline Phosphatase: 88 IU/L (ref 39–117)
BUN/Creatinine Ratio: 19 (ref 12–28)
BUN: 15 mg/dL (ref 8–27)
Bilirubin Total: 0.6 mg/dL (ref 0.0–1.2)
CALCIUM: 9.7 mg/dL (ref 8.7–10.3)
CHLORIDE: 98 mmol/L (ref 96–106)
CO2: 29 mmol/L (ref 20–29)
Creatinine, Ser: 0.79 mg/dL (ref 0.57–1.00)
GFR calc Af Amer: 81 mL/min/{1.73_m2} (ref >59)
GFR, EST NON AFRICAN AMERICAN: 70 mL/min/{1.73_m2} (ref >59)
Globulin, Total: 3.4 g/dL (ref 1.5–4.5)
Glucose: 101 mg/dL — ABNORMAL HIGH (ref 65–99)
POTASSIUM: 3.4 mmol/L — AB (ref 3.5–5.2)
Sodium: 143 mmol/L (ref 134–144)
Total Protein: 7.4 g/dL (ref 6.0–8.5)

## 2017-10-30 LAB — LIPID PANEL
Chol/HDL Ratio: 2.9 ratio (ref 0.0–4.4)
Cholesterol, Total: 132 mg/dL (ref 100–199)
HDL: 46 mg/dL (ref >39)
LDL Calculated: 52 mg/dL (ref 0–99)
TRIGLYCERIDES: 171 mg/dL — AB (ref 0–149)
VLDL CHOLESTEROL CAL: 34 mg/dL (ref 5–40)

## 2017-10-30 LAB — TSH: TSH: 0.071 u[IU]/mL — ABNORMAL LOW (ref 0.450–4.500)

## 2017-10-30 MED ORDER — LEVOTHYROXINE SODIUM 100 MCG PO TABS: 100.0000 ug | ORAL_TABLET | Freq: Every day | ORAL | 11 refills | Status: AC

## 2017-11-01 ENCOUNTER — Other Ambulatory Visit: Payer: Self-pay | Admitting: Family

## 2017-11-01 LAB — URINE CULTURE

## 2017-11-01 MED ORDER — AMOXICILLIN-POT CLAVULANATE 875-125 MG PO TABS: 1.0000 | ORAL_TABLET | Freq: Two times a day (BID) | ORAL | 0 refills | Status: DC

## 2017-12-10 ENCOUNTER — Telehealth: Payer: Self-pay | Admitting: *Deleted

## 2017-12-10 NOTE — Telephone Encounter (Signed)
ntbs

## 2017-12-10 NOTE — Telephone Encounter (Signed)
VM from [place] at [facility] Pt has conjunctivitis in both eyes can antibiotic be called in Please advice

## 2017-12-11 NOTE — Progress Notes (Signed)
Subjective: CC: pink eye PCP: Katherine Balloon, FNP Katherine Roth is a 84 y.o. female presenting to clinic today for:  1. Pink eye Patient is a resident of [facility].  She is accompanied by 1 of the caregivers there who reports that she has had crusting and redness of the left eye since Monday.  She reports that when patient wakes up the eyelashes are matted.  Denies associated fevers, chills, cough, congestion, nausea, vomiting, diarrhea.  Patient has not reported any visual disturbance or pain.  They have been keeping her eye clean but otherwise no therapies used.  Patient was unable to answer whether or not she was having pain or visual disturbance upon questioning.  ROS: Per HPI  No Known Allergies Past Medical History:  Diagnosis Date  . Arthritis   . Asthma   . Breast cancer ([place])   . Dementia   . Depression   . Dizziness   . Hallucination   . Hyperlipidemia   . Hypertension   . Hypothyroidism   . Macular degeneration     Current Outpatient Medications:  .  amoxicillin-clavulanate (AUGMENTIN) 875-125 MG tablet, Take 1 tablet by mouth 2 (two) times daily., Disp: 14 tablet, Rfl: 0 .  atenolol-chlorthalidone (TENORETIC) 100-25 MG per tablet, Take 1 tablet by mouth daily., Disp: , Rfl:  .  carbidopa-levodopa (SINEMET IR) 25-100 MG per tablet, TAKE 1 TABLET BY MOUTH 3 (THREE) TIMES DAILY., Disp: 15 tablet, Rfl: 0 .  clopidogrel (PLAVIX) 75 MG tablet, Take 1 tablet (75 mg total) by mouth daily., Disp: 30 tablet, Rfl: 2 .  donepezil (ARICEPT) 5 MG tablet, Take 5 mg by mouth at bedtime. , Disp: , Rfl:  .  FLUoxetine (PROZAC) 20 MG capsule, TAKE 1 CAPSULE EVERY DAY, Disp: 90 capsule, Rfl: 0 .  levothyroxine (SYNTHROID) 100 MCG tablet, Take 1 tablet (100 mcg total) by mouth daily., Disp: 30 tablet, Rfl: 11 .  nitrofurantoin (MACRODANTIN) 50 MG capsule, Take 1 capsule (50 mg total) by mouth daily., Disp: 90 capsule, Rfl: 1 .  potassium chloride (MICRO-K) 10 MEQ CR  capsule, Take by mouth. Take two 10 meq pills twice daily, Disp: , Rfl:  .  QUEtiapine (SEROQUEL) 25 MG tablet, TAKE 1 TABLET (25 MG TOTAL) BY MOUTH AT BEDTIME., Disp: 10 tablet, Rfl: 0 .  risperiDONE (RISPERDAL) 0.25 MG tablet, TAKE 1 TABLET (0.25 MG TOTAL) BY MOUTH AT BEDTIME., Disp: 30 tablet, Rfl: 0 .  rosuvastatin (CRESTOR) 5 MG tablet, Take 1 tablet (5 mg total) by mouth daily at 6 PM., Disp: 30 tablet, Rfl: 2 Social History   Socioeconomic History  . Marital status: Divorced    Spouse name: Not on file  . Number of children: 5  . Years of education: college  . Highest education level: Not on file  Social Needs  . Financial resource strain: Not on file  . Food insecurity - worry: Not on file  . Food insecurity - inability: Not on file  . Transportation needs - medical: Not on file  . Transportation needs - non-medical: Not on file  Occupational History  . Not on file  Tobacco Use  . Smoking status: Never Smoker  . Smokeless tobacco: Never Used  Substance and Sexual Activity  . Alcohol use: No  . Drug use: No  . Sexual activity: Not on file  Other Topics Concern  . Not on file  Social History Narrative   [facility]   Patient  is retired.   Patient has 5 children.   Patient has a college education.   Patient is right-handed.   Patient drinks a 8 oz of soda with her dinner.   Family History  Problem Relation Age of Onset  . Heart disease Mother        CHF  . Parkinson's disease Mother   . Osteoporosis Mother   . Heart disease Father        CABG, died age 15  . Hypertension Father   . Heart disease Paternal Grandmother     Objective: Office vital signs reviewed. BP 136/72   Pulse 61   Temp (!) 97.2 F (36.2 C) (Oral)   Ht 5\' 3"  (1.6 m)   Wt 127 lb (57.6 kg)   BMI 22.50 kg/m   Physical Examination:  General: Awake, alert, nontoxic appearing, slightly dissheveled elderly female, No acute distress Eyes: Arcus  senilis appreciated.  PERRLA.  EOMI.  No conjunctival injection noted.  She does have scant crusting along the lower left eyelash with mild inflammation of the lower left lid.  No overt purulence or ocular discharge noted. Neuro: Acknowledges provider but does not respond to questioning.  Follows Provider with eyes.  Assessment/ Plan: 84 y.o. female   1. Blepharitis of left lower eyelid, unspecified type No evidence of bacterial conjunctivitis on today's exam.  She does have slight inflammation of the left lower lid on exam consistent with a blepharitis.  Erythromycin applied to the left lower lid 3 times daily for the next 7 days prescribed.  Recommend warm washcloth to clean crusting in between applications of antibiotic ointment.  Return precautions reviewed with the caregiver who voiced good understanding.  Handout provided.  Paperwork completed.  Follow-up as needed.  Meds ordered this encounter  Medications  . erythromycin [facility]) ophthalmic ointment    Sig: Apply a small ribbon of ointment along the lower left lid 3 times daily x7 days    Dispense:  3.5 g    Refill:  0     [name], DO [place] [phone]

## 2017-12-11 NOTE — Telephone Encounter (Signed)
Spoke with International aid/development worker at [facility].  Informed that patient will need to be seen.  Tye Maryland will call back to make an appt.

## 2017-12-12 ENCOUNTER — Ambulatory Visit (INDEPENDENT_AMBULATORY_CARE_PROVIDER_SITE_OTHER): Payer: Medicare Other | Admitting: Family Medicine

## 2017-12-12 ENCOUNTER — Encounter: Payer: Self-pay | Admitting: Family Medicine

## 2017-12-12 VITALS — BP 136/72 | HR 61 | Temp 97.2°F | Ht 63.0 in | Wt 127.0 lb

## 2017-12-12 DIAGNOSIS — H01005 Unspecified blepharitis left lower eyelid: Secondary | ICD-10-CM | POA: Diagnosis not present

## 2017-12-12 MED ORDER — ERYTHROMYCIN 5 MG/GM OP OINT: TOPICAL_OINTMENT | OPHTHALMIC | 0 refills | Status: DC

## 2017-12-12 NOTE — Patient Instructions (Signed)
Blepharitis Blepharitis means swollen eyelids. Follow these instructions at home: Pay attention to any changes in how you look or feel. Follow these instructions to help with your condition: Keeping Clean  Wash your hands often.  Wash your eyelids with warm water, or wash them with warm water that is mixed with little bit of baby shampoo. Do this 2 or more times per day.  Wash your face and eyebrows at least once a day.  Use a clean towel each time you dry your eyelids. Do not use the towel to clean or dry other areas of your body. Do not share your towel with anyone. General instructions  Avoid wearing makeup until you get better. Do not share makeup with anyone.  Avoid rubbing your eyes.  Put a warm compress on your eyes 2 times per day for 10 minutes at a time or as told by your doctor.  If you were told to use an medicated cream or eye drops, use the medicine as told by your doctor. Do not stop using the medicine even if you feel better.  Keep all follow-up visits as told by your doctor. This is important. Contact a doctor if:  Your eyelids feel hot.  You have blisters on your eyelids.  You have a rash on your eyelids.  The swelling does not go away in 2-4 days.  The swelling gets worse. Get help right away if:  You have pain that gets worse.  You have pain that spreads to other parts of your face.  You have redness that gets worse.  You have redness that spreads to other parts of your face.  Your vision changes.  You have pain when you look at lights or things that move.  You have a fever. This information is not intended to replace advice given to you by your health care provider. Make sure you discuss any questions you have with your health care provider. Document Released: 07/04/2008 Document Revised: 03/02/2016 Document Reviewed: 01/18/2015 Elsevier Interactive Patient Education  2018 Elsevier Inc.  

## 2018-04-29 ENCOUNTER — Ambulatory Visit (INDEPENDENT_AMBULATORY_CARE_PROVIDER_SITE_OTHER): Payer: Medicare Other | Admitting: Family

## 2018-04-29 ENCOUNTER — Encounter: Payer: Self-pay | Admitting: Family

## 2018-04-29 VITALS — BP 137/78 | HR 53 | Temp 97.1°F | Ht 63.0 in | Wt 115.2 lb

## 2018-04-29 DIAGNOSIS — G20C Parkinsonism, unspecified: Secondary | ICD-10-CM

## 2018-04-29 DIAGNOSIS — M1990 Unspecified osteoarthritis, unspecified site: Secondary | ICD-10-CM

## 2018-04-29 DIAGNOSIS — Z87440 Personal history of urinary (tract) infections: Secondary | ICD-10-CM

## 2018-04-29 DIAGNOSIS — R413 Other amnesia: Secondary | ICD-10-CM | POA: Diagnosis not present

## 2018-04-29 DIAGNOSIS — E785 Hyperlipidemia, unspecified: Secondary | ICD-10-CM | POA: Diagnosis not present

## 2018-04-29 DIAGNOSIS — G20 Parkinson's disease: Secondary | ICD-10-CM

## 2018-04-29 DIAGNOSIS — E034 Atrophy of thyroid (acquired): Secondary | ICD-10-CM

## 2018-04-29 DIAGNOSIS — E876 Hypokalemia: Secondary | ICD-10-CM

## 2018-04-29 DIAGNOSIS — I10 Essential (primary) hypertension: Secondary | ICD-10-CM

## 2018-04-29 NOTE — Progress Notes (Signed)
Subjective:    Patient ID: Katherine Roth, female    DOB: 1933-09-21, 84 y.o.   MRN: [id number]  Chief Complaint  Patient presents with  . Medical Management of Chronic Issues    six month recheck   Pt presents to the office today for follow up. PT is a resident at [facility] and has dementia. She  has chronic UTI's and takes nitrofurantoin 50 mg daily. PT will shake her head no to questions, but is nonverbal.  Hypertension  This is a chronic problem. The current episode started more than 1 year ago. The problem has been resolved since onset. The problem is controlled. Pertinent negatives include no headaches, peripheral edema or shortness of breath. Risk factors for coronary artery disease include dyslipidemia. The current treatment provides moderate improvement. Hypertensive end-organ damage includes CVA. There is no history of CAD/MI or heart failure. Identifiable causes of hypertension include a thyroid problem.  Thyroid Problem  Presents for follow-up visit. Patient reports no constipation, depressed mood, dry skin or fatigue. The symptoms have been stable. Her past medical history is significant for hyperlipidemia. There is no history of heart failure.  Arthritis  Presents for follow-up visit. She reports no pain or stiffness. The symptoms have been stable. Pertinent negatives include no fatigue.  Hyperlipidemia  This is a chronic problem. The current episode started more than 1 year ago. The problem is controlled. Recent lipid tests were reviewed and are normal. Pertinent negatives include no shortness of breath. Current antihyperlipidemic treatment includes statins. The current treatment provides moderate improvement of lipids.  Parkinsonism  Pt taking sinemet TID. She has a tremor, but stable.     Review of Systems  Constitutional: Negative for fatigue.  Respiratory: Negative for shortness of breath.   Gastrointestinal: Negative for constipation.  Musculoskeletal: Positive  for arthritis. Negative for stiffness.  Neurological: Negative for headaches.  All other systems reviewed and are negative.      Objective:   Physical Exam  Constitutional: She appears well-developed and well-nourished. No distress.  HENT:  Head: Normocephalic and atraumatic.  Right Ear: External ear normal.  Left Ear: External ear normal.  Mouth/Throat: Oropharynx is clear and moist.  Eyes: Pupils are equal, round, and reactive to light.  Neck: Normal range of motion. Neck supple. No thyromegaly present.  Cardiovascular: Normal rate, regular rhythm, normal heart sounds and intact distal pulses.  No murmur heard. Pulmonary/Chest: Effort normal and breath sounds normal. No respiratory distress. She has no wheezes.  Abdominal: Soft. Bowel sounds are normal. She exhibits no distension. There is no tenderness.  Musculoskeletal: Normal range of motion. She exhibits no edema or tenderness.  Neurological: She is alert. She has normal reflexes. No cranial nerve deficit.  Skin: Skin is warm and dry.  Psychiatric: She is withdrawn. Cognition and memory are impaired. She is noncommunicative.  Flat affect  Vitals reviewed.   BP 137/78   Pulse (!) 53   Temp (!) 97.1 F (36.2 C) (Oral)   Ht 5' 3" (1.6 m)   Wt 115 lb 3.2 oz (52.3 kg)   BMI 20.41 kg/m      Assessment & Plan:  Katherine Roth comes in today with chief complaint of Medical Management of Chronic Issues (six month recheck)   Diagnosis and orders addressed:  1. Essential (primary) hypertension - CMP14+EGFR - CBC with Differential/Platelet  2. Arthritis - CMP14+EGFR - CBC with Differential/Platelet  3. Hyperlipidemia, unspecified hyperlipidemia type - CMP14+EGFR - CBC with Differential/Platelet  4. Hypothyroidism  due to acquired atrophy of thyroid - CMP14+EGFR - CBC with Differential/Platelet - TSH  5. Memory deficit - CMP14+EGFR - CBC with Differential/Platelet  6. Hypokalemia  - CMP14+EGFR - CBC with  Differential/Platelet  7. Primary Parkinsonism ([place]) - CMP14+EGFR - CBC with Differential/Platelet  8. Hx: UTI (urinary tract infection) - CMP14+EGFR - CBC with Differential/Platelet   Labs pending Health Maintenance reviewed Diet and exercise encouraged  Follow up plan: 6 months    Evelina Dun, FNP

## 2018-04-30 LAB — CMP14+EGFR
ALBUMIN: 3.6 g/dL (ref 3.5–4.7)
ALK PHOS: 76 IU/L (ref 39–117)
ALT: <5 IU/L (ref 0–32)
AST: 9 IU/L (ref 0–40)
Albumin/Globulin Ratio: 1.1 — ABNORMAL LOW (ref 1.2–2.2)
BILIRUBIN TOTAL: 0.4 mg/dL (ref 0.0–1.2)
BUN / CREAT RATIO: 13 (ref 12–28)
BUN: 12 mg/dL (ref 8–27)
CHLORIDE: 97 mmol/L (ref 96–106)
CO2: 28 mmol/L (ref 20–29)
Calcium: 9.4 mg/dL (ref 8.7–10.3)
Creatinine, Ser: 0.9 mg/dL (ref 0.57–1.00)
GFR calc Af Amer: 69 mL/min/{1.73_m2} (ref >59)
GFR calc non Af Amer: 60 mL/min/{1.73_m2} (ref >59)
GLOBULIN, TOTAL: 3.4 g/dL (ref 1.5–4.5)
GLUCOSE: 110 mg/dL — AB (ref 65–99)
Potassium: 3.4 mmol/L — ABNORMAL LOW (ref 3.5–5.2)
SODIUM: 140 mmol/L (ref 134–144)
Total Protein: 7 g/dL (ref 6.0–8.5)

## 2018-04-30 LAB — CBC WITH DIFFERENTIAL/PLATELET
BASOS ABS: 0.1 10*3/uL (ref 0.0–0.2)
Basos: 0 %
EOS (ABSOLUTE): 0.6 10*3/uL — AB (ref 0.0–0.4)
Eos: 4 %
HEMATOCRIT: 41.2 % (ref 34.0–46.6)
Hemoglobin: 13.3 g/dL (ref 11.1–15.9)
Immature Grans (Abs): 0 10*3/uL (ref 0.0–0.1)
Immature Granulocytes: 0 %
LYMPHS ABS: 2.6 10*3/uL (ref 0.7–3.1)
Lymphs: 20 %
MCH: 28.3 pg (ref 26.6–33.0)
MCHC: 32.3 g/dL (ref 31.5–35.7)
MCV: 88 fL (ref 79–97)
MONOS ABS: 0.9 10*3/uL (ref 0.1–0.9)
Monocytes: 7 %
NEUTROS PCT: 69 %
Neutrophils Absolute: 9.1 10*3/uL — ABNORMAL HIGH (ref 1.4–7.0)
PLATELETS: 479 10*3/uL — AB (ref 150–450)
RBC: 4.7 x10E6/uL (ref 3.77–5.28)
RDW: 13.1 % (ref 12.3–15.4)
WBC: 13.3 10*3/uL — AB (ref 3.4–10.8)

## 2018-04-30 LAB — TSH: TSH: 2.26 u[IU]/mL (ref 0.450–4.500)

## 2018-05-22 ENCOUNTER — Telehealth: Payer: Self-pay | Admitting: Family

## 2018-06-18 DIAGNOSIS — B351 Tinea unguium: Secondary | ICD-10-CM | POA: Diagnosis not present

## 2018-06-18 DIAGNOSIS — M2042 Other hammer toe(s) (acquired), left foot: Secondary | ICD-10-CM | POA: Diagnosis not present

## 2018-06-18 DIAGNOSIS — M79675 Pain in left toe(s): Secondary | ICD-10-CM | POA: Diagnosis not present

## 2018-06-18 DIAGNOSIS — I739 Peripheral vascular disease, unspecified: Secondary | ICD-10-CM | POA: Diagnosis not present

## 2018-06-21 ENCOUNTER — Ambulatory Visit (INDEPENDENT_AMBULATORY_CARE_PROVIDER_SITE_OTHER): Payer: Medicare Other

## 2018-06-21 ENCOUNTER — Ambulatory Visit (INDEPENDENT_AMBULATORY_CARE_PROVIDER_SITE_OTHER): Payer: Medicare Other | Admitting: Family Medicine

## 2018-06-21 ENCOUNTER — Encounter: Payer: Self-pay | Admitting: Family Medicine

## 2018-06-21 VITALS — BP 134/58 | HR 55 | Temp 97.6°F | Ht 63.0 in | Wt 120.2 lb

## 2018-06-21 DIAGNOSIS — M79641 Pain in right hand: Secondary | ICD-10-CM

## 2018-06-21 DIAGNOSIS — R410 Disorientation, unspecified: Secondary | ICD-10-CM | POA: Diagnosis not present

## 2018-06-21 NOTE — Progress Notes (Signed)
Chief Complaint  Patient presents with  . Hand Pain    pt here today c/o right hand pain/swelling    HPI  Patient presents today for swelling and pain in the right hand.  She points primarily to the base of the thumb.  Patient is unable to relate history due to advanced stages of dementia.  And attendant with her from the nursing home says that she acted out some yesterday and would like to have her checked for a urinary infection. PMH: Smoking status noted ROS: Per HPI  Objective: BP (!) 134/58   Pulse (!) 55   Temp 97.6 F (36.4 C) (Oral)   Ht 5\' 3"  (1.6 m)   Wt 120 lb 4 oz (54.5 kg)   BMI 21.30 kg/m  Gen: NAD, alert, cooperative with exam HEENT: NCAT, EOMI, PERRL CV: RRR, good S1/S2, no murmur Resp: CTABL, no wheezes, non-labored Ext: No edema, warm.  Multiple Heberden's nodes at the DIP and PIP of all of the fingers.  The right thumb has moderate edema without erythema.  Tender to palpation and range of motion. Neuro: Alert, no orientation to time place or person noted. X-ray thumb: Preliminary read by me shows degenerative joint disease moderately severe arthritis with erosions no fracture Assessment and plan:  1. Confusion   2. Right hand pain     No orders of the defined types were placed in this encounter.   Orders Placed This Encounter  Procedures  . Urine Culture  . DG Hand Complete Right    Standing Status:   Future    Number of Occurrences:   1    Standing Expiration Date:   08/21/2019    Order Specific Question:   Reason for Exam (SYMPTOM  OR DIAGNOSIS REQUIRED)    Answer:   right hand pain    Order Specific Question:   Preferred imaging location?    Answer:   Internal  . Urinalysis    Follow up as needed.  Claretta Fraise, MD

## 2018-06-24 ENCOUNTER — Other Ambulatory Visit: Payer: Medicare Other

## 2018-06-24 DIAGNOSIS — R410 Disorientation, unspecified: Secondary | ICD-10-CM | POA: Diagnosis not present

## 2018-06-24 LAB — URINALYSIS
BILIRUBIN UA: NEGATIVE
GLUCOSE, UA: NEGATIVE
Ketones, UA: NEGATIVE
Nitrite, UA: NEGATIVE
PROTEIN UA: NEGATIVE
SPEC GRAV UA: 1.02 (ref 1.005–1.030)
Urobilinogen, Ur: 0.2 mg/dL (ref 0.2–1.0)
pH, UA: 5.5 (ref 5.0–7.5)

## 2018-06-25 LAB — URINE CULTURE

## 2018-06-28 ENCOUNTER — Telehealth: Payer: Self-pay | Admitting: Family

## 2018-06-28 NOTE — Telephone Encounter (Signed)
[employer] to get results from urine ask for [employer] or [employer]

## 2018-06-28 NOTE — Telephone Encounter (Signed)
Urine culture was negative. WS

## 2018-06-28 NOTE — Telephone Encounter (Signed)
Patient seen Dr. Livia Snellen 9/13- please advise labs

## 2018-06-28 NOTE — Telephone Encounter (Signed)
Katherine Roth aware of results

## 2018-07-04 ENCOUNTER — Other Ambulatory Visit: Payer: Self-pay

## 2018-07-04 ENCOUNTER — Emergency Department (HOSPITAL_COMMUNITY): Payer: Medicare Other

## 2018-07-04 ENCOUNTER — Emergency Department (HOSPITAL_COMMUNITY)
Admission: EM | Admit: 2018-07-04 | Discharge: 2018-07-04 | Disposition: A | Discharge status: Home / Self Care | Payer: Medicare Other | Attending: Emergency Medicine | Admitting: Emergency Medicine

## 2018-07-04 ENCOUNTER — Encounter (HOSPITAL_COMMUNITY): Payer: Self-pay | Admitting: Emergency Medicine

## 2018-07-04 DIAGNOSIS — I10 Essential (primary) hypertension: Secondary | ICD-10-CM | POA: Insufficient documentation

## 2018-07-04 DIAGNOSIS — G20 Parkinson's disease: Secondary | ICD-10-CM | POA: Diagnosis not present

## 2018-07-04 DIAGNOSIS — F0390 Unspecified dementia without behavioral disturbance: Secondary | ICD-10-CM | POA: Insufficient documentation

## 2018-07-04 DIAGNOSIS — J9811 Atelectasis: Secondary | ICD-10-CM | POA: Diagnosis not present

## 2018-07-04 DIAGNOSIS — Z853 Personal history of malignant neoplasm of breast: Secondary | ICD-10-CM | POA: Insufficient documentation

## 2018-07-04 DIAGNOSIS — J45909 Unspecified asthma, uncomplicated: Secondary | ICD-10-CM | POA: Insufficient documentation

## 2018-07-04 DIAGNOSIS — R531 Weakness: Secondary | ICD-10-CM | POA: Diagnosis not present

## 2018-07-04 DIAGNOSIS — R55 Syncope and collapse: Secondary | ICD-10-CM | POA: Diagnosis not present

## 2018-07-04 DIAGNOSIS — F329 Major depressive disorder, single episode, unspecified: Secondary | ICD-10-CM | POA: Diagnosis not present

## 2018-07-04 DIAGNOSIS — E876 Hypokalemia: Secondary | ICD-10-CM | POA: Diagnosis not present

## 2018-07-04 DIAGNOSIS — Z139 Encounter for screening, unspecified: Secondary | ICD-10-CM

## 2018-07-04 DIAGNOSIS — E039 Hypothyroidism, unspecified: Secondary | ICD-10-CM | POA: Insufficient documentation

## 2018-07-04 DIAGNOSIS — Z8673 Personal history of transient ischemic attack (TIA), and cerebral infarction without residual deficits: Secondary | ICD-10-CM | POA: Insufficient documentation

## 2018-07-04 DIAGNOSIS — R0902 Hypoxemia: Secondary | ICD-10-CM | POA: Diagnosis not present

## 2018-07-04 DIAGNOSIS — Z7902 Long term (current) use of antithrombotics/antiplatelets: Secondary | ICD-10-CM | POA: Diagnosis not present

## 2018-07-04 DIAGNOSIS — Z79899 Other long term (current) drug therapy: Secondary | ICD-10-CM | POA: Diagnosis not present

## 2018-07-04 DIAGNOSIS — G9389 Other specified disorders of brain: Secondary | ICD-10-CM | POA: Diagnosis not present

## 2018-07-04 DIAGNOSIS — Z7401 Bed confinement status: Secondary | ICD-10-CM | POA: Diagnosis not present

## 2018-07-04 DIAGNOSIS — Z0000 Encounter for general adult medical examination without abnormal findings: Secondary | ICD-10-CM | POA: Diagnosis not present

## 2018-07-04 DIAGNOSIS — Z743 Need for continuous supervision: Secondary | ICD-10-CM | POA: Diagnosis not present

## 2018-07-04 DIAGNOSIS — R404 Transient alteration of awareness: Secondary | ICD-10-CM | POA: Diagnosis not present

## 2018-07-04 LAB — TROPONIN I: Troponin I: <0.03 ng/mL (ref <0.03)

## 2018-07-04 LAB — BASIC METABOLIC PANEL
Anion gap: 7 (ref 5–15)
BUN: 18 mg/dL (ref 8–23)
CHLORIDE: 100 mmol/L (ref 98–111)
CO2: 28 mmol/L (ref 22–32)
CREATININE: 1.01 mg/dL — AB (ref 0.44–1.00)
Calcium: 8.4 mg/dL — ABNORMAL LOW (ref 8.9–10.3)
GFR calc non Af Amer: 50 mL/min — ABNORMAL LOW (ref >60)
GFR, EST AFRICAN AMERICAN: 58 mL/min — AB (ref >60)
Glucose, Bld: 101 mg/dL — ABNORMAL HIGH (ref 70–99)
Potassium: 3 mmol/L — ABNORMAL LOW (ref 3.5–5.1)
Sodium: 135 mmol/L (ref 135–145)

## 2018-07-04 LAB — CBC
HCT: 40.5 % (ref 36.0–46.0)
HEMOGLOBIN: 13.5 g/dL (ref 12.0–15.0)
MCH: 29.1 pg (ref 26.0–34.0)
MCHC: 33.3 g/dL (ref 30.0–36.0)
MCV: 87.3 fL (ref 78.0–100.0)
PLATELETS: 292 10*3/uL (ref 150–400)
RBC: 4.64 MIL/uL (ref 3.87–5.11)
RDW: 14.1 % (ref 11.5–15.5)
WBC: 10.5 10*3/uL (ref 4.0–10.5)

## 2018-07-04 LAB — DIFFERENTIAL
BASOS ABS: 0.1 10*3/uL (ref 0.0–0.1)
Basophils Relative: 1 %
Eosinophils Absolute: 0.5 10*3/uL (ref 0.0–0.7)
Eosinophils Relative: 5 %
LYMPHS ABS: 2.5 10*3/uL (ref 0.7–4.0)
LYMPHS PCT: 24 %
Monocytes Absolute: 1.1 10*3/uL — ABNORMAL HIGH (ref 0.1–1.0)
Monocytes Relative: 10 %
NEUTROS ABS: 6.3 10*3/uL (ref 1.7–7.7)
NEUTROS PCT: 60 %

## 2018-07-04 LAB — URINALYSIS, ROUTINE W REFLEX MICROSCOPIC
Bilirubin Urine: NEGATIVE
Glucose, UA: NEGATIVE mg/dL
HGB URINE DIPSTICK: NEGATIVE
Ketones, ur: NEGATIVE mg/dL
Leukocytes, UA: NEGATIVE
Nitrite: NEGATIVE
PH: 7 (ref 5.0–8.0)
Protein, ur: NEGATIVE mg/dL
SPECIFIC GRAVITY, URINE: 1.01 (ref 1.005–1.030)

## 2018-07-04 LAB — LACTIC ACID, PLASMA
LACTIC ACID, VENOUS: 1.3 mmol/L (ref 0.5–1.9)
Lactic Acid, Venous: 1.3 mmol/L (ref 0.5–1.9)

## 2018-07-04 LAB — MAGNESIUM: MAGNESIUM: 1.9 mg/dL (ref 1.7–2.4)

## 2018-07-04 MED ORDER — POTASSIUM CHLORIDE CRYS ER 20 MEQ PO TBCR
40.0000 meq | EXTENDED_RELEASE_TABLET | Freq: Once | ORAL | Status: AC
  Administered 2018-07-04: 40 meq via ORAL
  Filled 2018-07-04: qty 2

## 2018-07-04 NOTE — Discharge Instructions (Addendum)
Take your usual prescriptions as previously directed. Your potassium was mildly decreased on your lab tests today and you were given a dose in the Emergency Department. Your regular medical doctor will need to re-check your potassium level in the next several days.  Call your regular medical doctor tomorrow to schedule a follow up appointment within the next 2 to 3 days.  Return to the Emergency Department immediately sooner if worsening.

## 2018-07-04 NOTE — ED Provider Notes (Signed)
[facility] EMERGENCY DEPARTMENT Provider Note   CSN: [id number] Arrival date & time: 07/04/18  1438     History   Chief Complaint Chief Complaint  Patient presents with  . Near Syncope    HPI Katherine Roth is a 84 y.o. female.  The history is provided by the nursing home, the EMS personnel and the patient. The history is limited by the condition of the patient (Hx dementia).  Near Syncope   Pt was seen at 1530. Per EMS and NH report: EMS states NH informed them pt's "SBP was 40's" and she "nearly passed out." EMS arrived on scene with "BP 174/65, CBG 106."  Pt has significant hx of dementia and currently denies complaints.   Past Medical History:  Diagnosis Date  . Arthritis   . Asthma   . Breast cancer ([place])   . Dementia   . Depression   . Dizziness   . Hallucination   . Hyperlipidemia   . Hypertension   . Hypothyroidism   . Macular degeneration     Patient Active Problem List   Diagnosis Date Noted  . Pressure ulcer of sacral region, stage 3 ([place]) 03/22/2017  . Late effect of cerebrovascular accident (CVA) 11/03/2016  . UTI (urinary tract infection) 11/03/2016  . Memory deficit 07/24/2016  . Hypokalemia 01/24/2016  . Metabolic syndrome 58/52/7782  . Has a tremor 03/25/2014  . Breast CA ([place]) 03/25/2014  . Essential (primary) hypertension 03/25/2014  . Hypothyroidism 01/31/2014  . Breast mass, right 01/31/2014  . Hyperlipidemia 01/31/2014  . Parkinsonism ([place]) 09/25/2013  . Gait instability 09/25/2013  . Hallucinations 09/25/2013  . Arthritis 07/22/2013  . H/O: CVA (cerebrovascular accident) 07/22/2013    Past Surgical History:  Procedure Laterality Date  . ABDOMINAL HYSTERECTOMY    . APPENDECTOMY    . BREAST SURGERY    . LUMBAR DISC SURGERY    . MASTECTOMY Right 04/2014     OB History   None      Home Medications    Prior to Admission medications   Medication Sig Start Date End Date Taking? Authorizing Provider    atenolol-chlorthalidone (TENORETIC) 100-25 MG per tablet Take 1 tablet by mouth daily.    [provider]  carbidopa-levodopa (SINEMET IR) 25-100 MG per tablet TAKE 1 TABLET BY MOUTH 3 (THREE) TIMES DAILY. 10/01/14   Marcial Pacas, MD  clopidogrel (PLAVIX) 75 MG tablet Take 1 tablet (75 mg total) by mouth daily. 11/06/16   Isaac Bliss, Rayford Halsted, MD  FLUoxetine (PROZAC) 20 MG capsule TAKE 1 CAPSULE EVERY DAY 07/15/14   Lysbeth Penner, FNP  levothyroxine (SYNTHROID) 100 MCG tablet Take 1 tablet (100 mcg total) by mouth daily. 10/30/17 10/30/18  Sharion Balloon, FNP  nitrofurantoin (MACRODANTIN) 50 MG capsule Take 1 capsule (50 mg total) by mouth daily. 04/05/17   Evelina Dun A, FNP  potassium chloride (MICRO-K) 10 MEQ CR capsule Take by mouth. Take two 10 meq pills twice daily 12/23/16   [provider]  QUEtiapine (SEROQUEL) 25 MG tablet TAKE 1 TABLET (25 MG TOTAL) BY MOUTH AT BEDTIME. 10/08/14   Kathrynn Ducking, MD  risperiDONE (RISPERDAL) 0.25 MG tablet TAKE 1 TABLET (0.25 MG TOTAL) BY MOUTH AT BEDTIME. 08/28/14   Hassell Done, Mary-Margaret, FNP  rosuvastatin (CRESTOR) 5 MG tablet Take 1 tablet (5 mg total) by mouth daily at 6 PM. 11/06/16   Isaac Bliss, Rayford Halsted, MD    Family History Family History  Problem Relation Age of Onset  .  Heart disease Mother        CHF  . Parkinson's disease Mother   . Osteoporosis Mother   . Heart disease Father        CABG, died age 50  . Hypertension Father   . Heart disease Paternal Grandmother     Social History Social History   Tobacco Use  . Smoking status: Never Smoker  . Smokeless tobacco: Never Used  Substance Use Topics  . Alcohol use: No  . Drug use: No     Allergies   Patient has no known allergies.   Review of Systems Review of Systems  Unable to perform ROS: Dementia  Cardiovascular: Positive for near-syncope.     Physical Exam Updated Vital Signs BP (!) 168/53   Pulse 61   Resp 14   Ht 5\' 3"   (1.6 m)   Wt 54 kg   SpO2 98%   BMI 21.09 kg/m    Patient Vitals for the past 24 hrs:  BP Temp Temp src Pulse Resp SpO2 Height Weight  07/04/18 1630 137/74 - - - 15 90 % - -  07/04/18 1620 - 98.3 F (36.8 C) Oral - - - - -  07/04/18 1600 (!) 168/63 - - 61 17 98 % - -  07/04/18 1555 - - - 61 14 98 % - -  07/04/18 1530 (!) 168/53 - - (!) 58 14 98 % - -  07/04/18 1500 (!) 173/61 - - 61 16 93 % - -  07/04/18 1440 - - - - - - 5\' 3"  (1.6 m) 54 kg   16:03 Orthostatic Vital Signs VP  Orthostatic Lying   BP- Lying: 176/59   Pulse- Lying: 60       Orthostatic Sitting  BP- Sitting: 169/74   Pulse- Sitting: 63       Orthostatic Standing at 0 minutes  BP- Standing at 0 minutes: 168/63   Pulse- Standing at 0 minutes: 65    16:07:25 ED Notes VP  Patient was able to stand with minimum assistance.    Physical Exam 1535: Physical examination:  Nursing notes reviewed; Vital signs and O2 SAT reviewed;  Constitutional: Well developed, Well nourished, Well hydrated, In no acute distress; Head:  Normocephalic, atraumatic; Eyes: EOMI, PERRL, No scleral icterus; ENMT: Mouth and pharynx normal, Mucous membranes moist; Neck: Supple, Full range of motion, No lymphadenopathy; Cardiovascular: Regular rate and rhythm, No gallop; Respiratory: Breath sounds clear & equal bilaterally, No wheezes.  Speaking full sentences with ease, Normal respiratory effort/excursion; Chest: Nontender, Movement normal; Abdomen: Soft, Nontender, Nondistended, Normal bowel sounds; Genitourinary: No CVA tenderness; Extremities: Peripheral pulses normal, No tenderness, No edema, No calf edema or asymmetry.; Neuro: Awake, alert, confused per hx dementia. No facial droop. +tremor. Speech clear. Moves all extremities on stretcher spontaneously and to command without apparent gross focal motor deficits.; Skin: Color normal, Warm, Dry.   ED Treatments / Results  Labs (all labs ordered are listed, but only abnormal results are  displayed)   EKG EKG Interpretation  Date/Time:  Thursday July 04 2018 14:43:00 EDT Ventricular Rate:  60 PR Interval:    QRS Duration: 98 QT Interval:  530 QTC Calculation: 530 R Axis:   -5 Text Interpretation:  Normal sinus rhythm Paired ventricular premature complexes Anterior infarct , age undetermined Prolonged QT interval Baseline wander Artifact When compared with ECG of 11/03/2016 QT has lengthened Confirmed by Francine Graven [phone]) on 07/04/2018 3:56:26 PM   Radiology   Procedures Procedures (including critical  care time)  Medications Ordered in ED Medications - No data to display   Initial Impression / Assessment and Plan / ED Course  I have reviewed the triage vital signs and the nursing notes.  Pertinent labs & imaging results that were available during my care of the patient were reviewed by me and considered in my medical decision making (see chart for details).  MDM Reviewed: previous chart, nursing note and vitals Reviewed previous: labs and ECG Interpretation: labs, ECG, x-ray and CT scan   Results for orders placed or performed during the hospital encounter of 17/61/60  Basic metabolic panel  Result Value Ref Range   Sodium 135 135 - 145 mmol/L   Potassium 3.0 (L) 3.5 - 5.1 mmol/L   Chloride 100 98 - 111 mmol/L   CO2 28 22 - 32 mmol/L   Glucose, Bld 101 (H) 70 - 99 mg/dL   BUN 18 8 - 23 mg/dL   Creatinine, Ser 1.01 (H) 0.44 - 1.00 mg/dL   Calcium 8.4 (L) 8.9 - 10.3 mg/dL   GFR calc non Af Amer 50 (L) >60 mL/min   GFR calc Af Amer 58 (L) >60 mL/min   Anion gap 7 5 - 15  CBC  Result Value Ref Range   WBC 10.5 4.0 - 10.5 K/uL   RBC 4.64 3.87 - 5.11 MIL/uL   Hemoglobin 13.5 12.0 - 15.0 g/dL   HCT 40.5 36.0 - 46.0 %   MCV 87.3 78.0 - 100.0 fL   MCH 29.1 26.0 - 34.0 pg   MCHC 33.3 30.0 - 36.0 g/dL   RDW 14.1 11.5 - 15.5 %   Platelets 292 150 - 400 K/uL  Urinalysis, Routine w reflex microscopic  Result Value Ref Range   Color, Urine  STRAW (A) YELLOW   APPearance CLEAR CLEAR   Specific Gravity, Urine 1.010 1.005 - 1.030   pH 7.0 5.0 - 8.0   Glucose, UA NEGATIVE NEGATIVE mg/dL   Hgb urine dipstick NEGATIVE NEGATIVE   Bilirubin Urine NEGATIVE NEGATIVE   Ketones, ur NEGATIVE NEGATIVE mg/dL   Protein, ur NEGATIVE NEGATIVE mg/dL   Nitrite NEGATIVE NEGATIVE   Leukocytes, UA NEGATIVE NEGATIVE  Troponin I  Result Value Ref Range   Troponin I <0.03 <0.03 ng/mL  Lactic acid, plasma  Result Value Ref Range   Lactic Acid, Venous 1.3 0.5 - 1.9 mmol/L  Lactic acid, plasma  Result Value Ref Range   Lactic Acid, Venous 1.3 0.5 - 1.9 mmol/L  Differential  Result Value Ref Range   Neutrophils Relative % 60 %   Neutro Abs 6.3 1.7 - 7.7 K/uL   Lymphocytes Relative 24 %   Lymphs Abs 2.5 0.7 - 4.0 K/uL   Monocytes Relative 10 %   Monocytes Absolute 1.1 (H) 0.1 - 1.0 K/uL   Eosinophils Relative 5 %   Eosinophils Absolute 0.5 0.0 - 0.7 K/uL   Basophils Relative 1 %   Basophils Absolute 0.1 0.0 - 0.1 K/uL  Magnesium  Result Value Ref Range   Magnesium 1.9 1.7 - 2.4 mg/dL   Dg Chest 2 View Result Date: 07/04/2018 CLINICAL DATA:  Hypotension. EXAM: CHEST - 2 VIEW COMPARISON:  11/03/2016. FINDINGS: Cardiomegaly with normal pulmonary vascularity. Mild bibasilar atelectasis. Improved aeration from prior exam. Biapical pleural thickening noted consistent with scarring. No acute bony abnormality. IMPRESSION: 1. Mild bibasilar subsegmental atelectasis. Improved aeration from prior exam. 2.  Stable cardiomegaly with normal pulmonary vascularity. Electronically Signed   By: Marcello Moores  Register  On: 07/04/2018 16:23   Ct Head Wo Contrast Result Date: 07/04/2018 CLINICAL DATA:  Hypotension.  History of dementia. EXAM: CT HEAD WITHOUT CONTRAST TECHNIQUE: Contiguous axial images were obtained from the base of the skull through the vertex without intravenous contrast. COMPARISON:  11/03/2016 FINDINGS: Brain: Ventricles and cisterns are within  normal. There is mild age related atrophic change. There is chronic ischemic microvascular disease. No mass, mass effect, shift of midline structures or acute hemorrhage. No evidence of acute infarction. Vascular: No hyperdense vessel or unexpected calcification. Skull: Normal. Negative for fracture or focal lesion. Sinuses/Orbits: No acute finding. Other: None. IMPRESSION: No acute findings. Chronic ischemic microvascular disease and age related atrophic change. Electronically Signed   By: Marin Olp M.D.   On: 07/04/2018 17:19    1800:  Pt has been active, able to stand with minimal assistance and attempting to get out of bed. Pt easily redirected. Pleasant and calm. No hypotension in the ED. Not orthostatic on VS. Potassium repleted PO.  Workup otherwise reassuring. Pt has tol PO well while in the ED without N/V. No clear indication for admission at this time. Will d/c back to NH to f/u with PMD.     Final Clinical Impressions(s) / ED Diagnoses   Final diagnoses:  None    ED Discharge Orders    None       Francine Graven, DO 07/05/18 2141

## 2018-07-04 NOTE — Telephone Encounter (Addendum)
FYI for [name] from [facility] wants to let Katherine Roth know that pt was sent out to [facility] bp was low and acted like she was going to faint. Do not have to call back

## 2018-07-04 NOTE — ED Triage Notes (Signed)
Pt from [facility] for hypotension.  Ems states they were told pt's bp was 40's sys and she nearly passed out.  Ems states last bp 174/65 and CBG 106.  Pt is awake and alert.

## 2018-07-04 NOTE — Telephone Encounter (Deleted)
FYI for [name] from [facility] wants to let Lenna Gilford know that pt was sent out to [facility] bp was low and acted like she was going to faint. Do not have to call back

## 2018-07-04 NOTE — ED Notes (Signed)
Patient transported to X-ray 

## 2018-07-04 NOTE — ED Notes (Signed)
Report called to [place].  Awaiting ems transport back to facility

## 2018-07-04 NOTE — ED Notes (Signed)
Patient came in with an adult diaper on that was saturated with urine.  Patient was changed.

## 2018-07-04 NOTE — ED Notes (Signed)
Patient was able to stand with minimum assistance.

## 2018-07-04 NOTE — Telephone Encounter (Signed)
FYI - christy 

## 2018-07-05 LAB — URINE CULTURE: CULTURE: NO GROWTH

## 2018-07-08 ENCOUNTER — Encounter: Payer: Self-pay | Admitting: Family

## 2018-07-08 ENCOUNTER — Ambulatory Visit (INDEPENDENT_AMBULATORY_CARE_PROVIDER_SITE_OTHER): Payer: Medicare Other | Admitting: Family

## 2018-07-08 VITALS — BP 131/68 | HR 55 | Temp 98.0°F | Ht 63.0 in | Wt 125.2 lb

## 2018-07-08 DIAGNOSIS — Z09 Encounter for follow-up examination after completed treatment for conditions other than malignant neoplasm: Secondary | ICD-10-CM

## 2018-07-08 DIAGNOSIS — E876 Hypokalemia: Secondary | ICD-10-CM

## 2018-07-08 DIAGNOSIS — F0390 Unspecified dementia without behavioral disturbance: Secondary | ICD-10-CM

## 2018-07-08 DIAGNOSIS — R55 Syncope and collapse: Secondary | ICD-10-CM | POA: Diagnosis not present

## 2018-07-08 NOTE — Progress Notes (Addendum)
   Subjective:    Patient ID: Katherine Roth, female    DOB: 10/07/1933, 84 y.o.   MRN: [id number]  Chief Complaint  Patient presents with  . Hospitalization Follow-up    low potassium and blood pressure    HPI Pt presents to the office today for hospital follow up. She is a resident at [facility]. She went to the ED on 07/04/18 for low B/P and near syncope episode.   She was found to be hypokalemia of 3.0 and had negative CT head.   Her caregiver states she is going better and she has actually been talking.   Hospital notes reviewed.   Review of Systems  Unable to perform ROS: Dementia       Objective:   Physical Exam  Constitutional: She is oriented to person, place, and time. She appears well-developed and well-nourished. No distress.  HENT:  Head: Normocephalic and atraumatic.  Right Ear: External ear normal.  Left Ear: External ear normal.  Mouth/Throat: Oropharynx is clear and moist.  Eyes: Pupils are equal, round, and reactive to light.  Neck: Normal range of motion. Neck supple. No thyromegaly present.  Cardiovascular: Normal rate, regular rhythm, normal heart sounds and intact distal pulses.  No murmur heard. Pulmonary/Chest: Effort normal and breath sounds normal. No respiratory distress. She has no wheezes.  Abdominal: Soft. Bowel sounds are normal. She exhibits no distension. There is no tenderness.  Musculoskeletal: Normal range of motion. She exhibits no edema or tenderness.  Neurological: She is oriented to person, place, and time. She has normal reflexes. No cranial nerve deficit.  Skin: Skin is warm and dry.  Psychiatric: She has a normal mood and affect. Judgment and thought content normal. She is withdrawn. Cognition and memory are impaired. She is noncommunicative.  Withdrawn  Vitals reviewed.     BP 131/68   Pulse (!) 55   Temp 98 F (36.7 C) (Oral)   Ht '5\' 3"'$  (1.6 m)   Wt 125 lb 3.2 oz (56.8 kg)   BMI 22.18 kg/m      Assessment &  Plan:  Katherine Roth comes in today with chief complaint of Hospitalization Follow-up (low potassium and blood pressure)   Diagnosis and orders addressed:  1. Dementia without behavioral disturbance, unspecified dementia type ([place]) - CMP14+EGFR  2. Hospital discharge follow-up - CMP14+EGFR  3. Hypokalemia - CMP14+EGFR  4. Near syncope Resolved    Labs pending Continue medicaitons  Follow up plan: 3 months    Evelina Dun, FNP

## 2018-07-08 NOTE — Patient Instructions (Signed)

## 2018-07-09 LAB — CMP14+EGFR
ALBUMIN: 3.8 g/dL (ref 3.5–4.7)
ALT: 6 IU/L (ref 0–32)
AST: 15 IU/L (ref 0–40)
Albumin/Globulin Ratio: 1.2 (ref 1.2–2.2)
Alkaline Phosphatase: 70 IU/L (ref 39–117)
BUN / CREAT RATIO: 25 (ref 12–28)
BUN: 21 mg/dL (ref 8–27)
Bilirubin Total: 0.4 mg/dL (ref 0.0–1.2)
CALCIUM: 9.4 mg/dL (ref 8.7–10.3)
CO2: 28 mmol/L (ref 20–29)
CREATININE: 0.84 mg/dL (ref 0.57–1.00)
Chloride: 101 mmol/L (ref 96–106)
GFR, EST AFRICAN AMERICAN: 74 mL/min/{1.73_m2} (ref >59)
GFR, EST NON AFRICAN AMERICAN: 64 mL/min/{1.73_m2} (ref >59)
GLOBULIN, TOTAL: 3.3 g/dL (ref 1.5–4.5)
Glucose: 125 mg/dL — ABNORMAL HIGH (ref 65–99)
Potassium: 4.1 mmol/L (ref 3.5–5.2)
Sodium: 143 mmol/L (ref 134–144)
TOTAL PROTEIN: 7.1 g/dL (ref 6.0–8.5)

## 2018-07-22 ENCOUNTER — Other Ambulatory Visit: Payer: Self-pay | Admitting: Family

## 2018-08-29 DIAGNOSIS — G20 Parkinson's disease: Secondary | ICD-10-CM | POA: Diagnosis not present

## 2018-08-29 DIAGNOSIS — E782 Mixed hyperlipidemia: Secondary | ICD-10-CM | POA: Diagnosis not present

## 2018-08-29 DIAGNOSIS — I10 Essential (primary) hypertension: Secondary | ICD-10-CM | POA: Diagnosis not present

## 2018-08-29 DIAGNOSIS — E039 Hypothyroidism, unspecified: Secondary | ICD-10-CM | POA: Diagnosis not present

## 2018-08-30 DIAGNOSIS — D518 Other vitamin B12 deficiency anemias: Secondary | ICD-10-CM | POA: Diagnosis not present

## 2018-08-30 DIAGNOSIS — E038 Other specified hypothyroidism: Secondary | ICD-10-CM | POA: Diagnosis not present

## 2018-08-30 DIAGNOSIS — E119 Type 2 diabetes mellitus without complications: Secondary | ICD-10-CM | POA: Diagnosis not present

## 2018-08-30 DIAGNOSIS — Z79899 Other long term (current) drug therapy: Secondary | ICD-10-CM | POA: Diagnosis not present

## 2018-08-30 DIAGNOSIS — E7849 Other hyperlipidemia: Secondary | ICD-10-CM | POA: Diagnosis not present

## 2018-09-02 ENCOUNTER — Telehealth: Payer: Self-pay | Admitting: Family

## 2018-09-02 DIAGNOSIS — N390 Urinary tract infection, site not specified: Secondary | ICD-10-CM | POA: Diagnosis not present

## 2018-09-02 NOTE — Telephone Encounter (Signed)
Ok

## 2018-09-26 DIAGNOSIS — G20 Parkinson's disease: Secondary | ICD-10-CM | POA: Diagnosis not present

## 2018-09-26 DIAGNOSIS — E039 Hypothyroidism, unspecified: Secondary | ICD-10-CM | POA: Diagnosis not present

## 2018-09-26 DIAGNOSIS — B351 Tinea unguium: Secondary | ICD-10-CM | POA: Diagnosis not present

## 2018-09-26 DIAGNOSIS — E876 Hypokalemia: Secondary | ICD-10-CM | POA: Diagnosis not present

## 2018-09-26 DIAGNOSIS — I10 Essential (primary) hypertension: Secondary | ICD-10-CM | POA: Diagnosis not present

## 2018-10-10 DIAGNOSIS — E7849 Other hyperlipidemia: Secondary | ICD-10-CM | POA: Diagnosis not present

## 2018-10-10 DIAGNOSIS — E038 Other specified hypothyroidism: Secondary | ICD-10-CM | POA: Diagnosis not present

## 2018-10-10 DIAGNOSIS — Z79899 Other long term (current) drug therapy: Secondary | ICD-10-CM | POA: Diagnosis not present

## 2018-10-10 DIAGNOSIS — E119 Type 2 diabetes mellitus without complications: Secondary | ICD-10-CM | POA: Diagnosis not present

## 2018-10-10 DIAGNOSIS — E559 Vitamin D deficiency, unspecified: Secondary | ICD-10-CM | POA: Diagnosis not present

## 2018-10-24 DIAGNOSIS — N3000 Acute cystitis without hematuria: Secondary | ICD-10-CM | POA: Diagnosis not present

## 2018-10-24 DIAGNOSIS — I10 Essential (primary) hypertension: Secondary | ICD-10-CM | POA: Diagnosis not present

## 2018-10-24 DIAGNOSIS — G20 Parkinson's disease: Secondary | ICD-10-CM | POA: Diagnosis not present

## 2018-10-24 IMAGING — CT CT HEAD W/O CM
3 series · 15 of 47 positions shown, 18 images · non-contrast
Comparison: 11/03/2016

CLINICAL DATA: Hypotension.  History of dementia.

EXAM:
CT HEAD WITHOUT CONTRAST
TECHNIQUE: Contiguous axial images were obtained from the base of the skull
through the vertex without intravenous contrast.

[Series 2: head wo · axial · 0.43mm/px · z∈[+16,+141]mm · 9 of 31 slices shown, 12 images]
[im 3/31  brain]
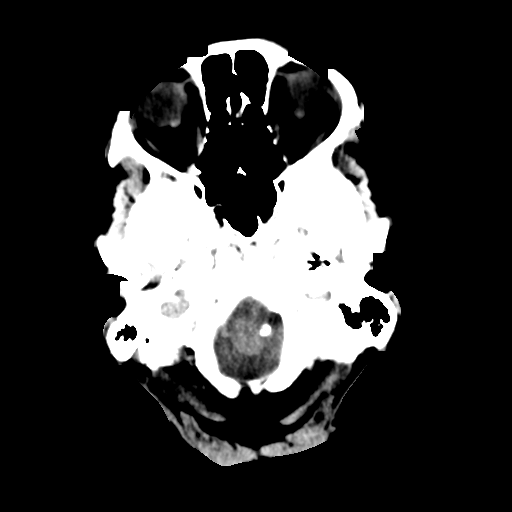
[im 3/31  bone]
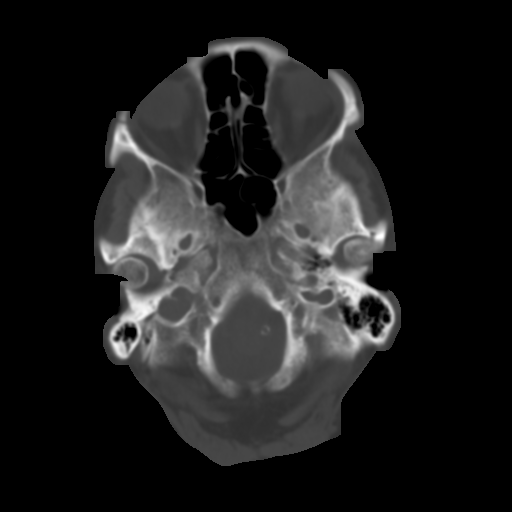
[im 6/31  brain]
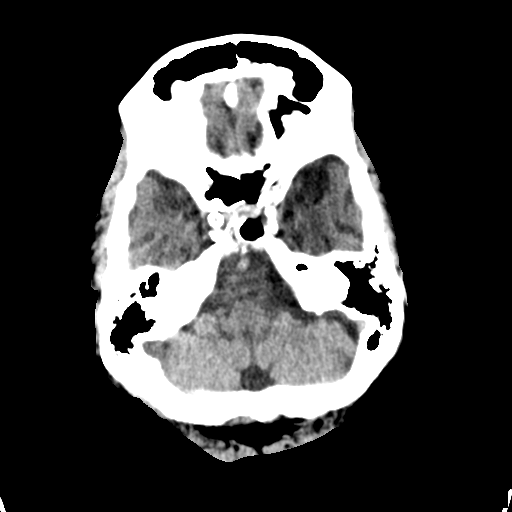
[im 9/31  brain]
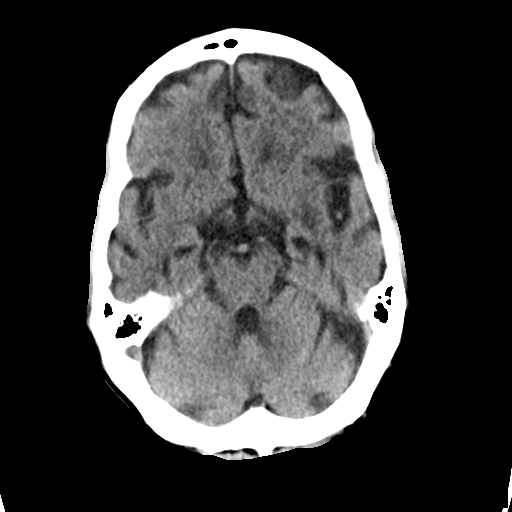
[im 12/31  brain]
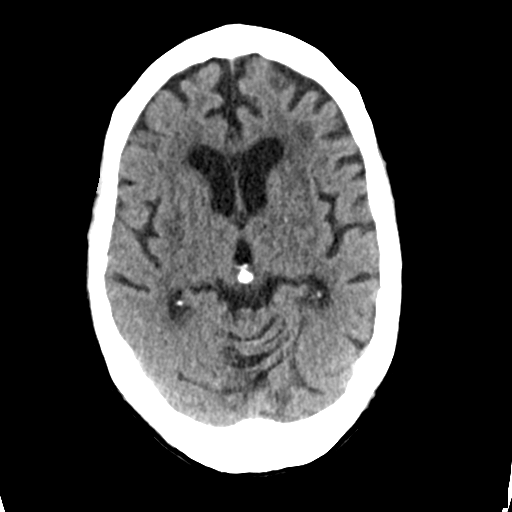
[im 16/31  brain]
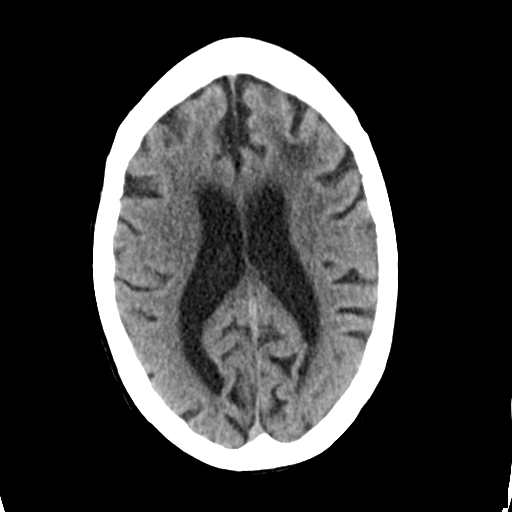
[im 16/31  bone]
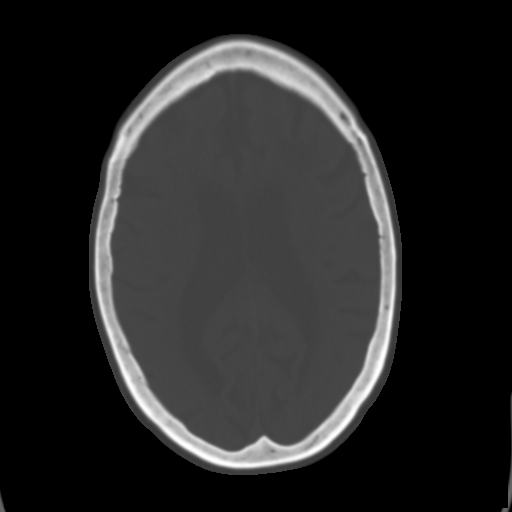
[im 19/31  brain]
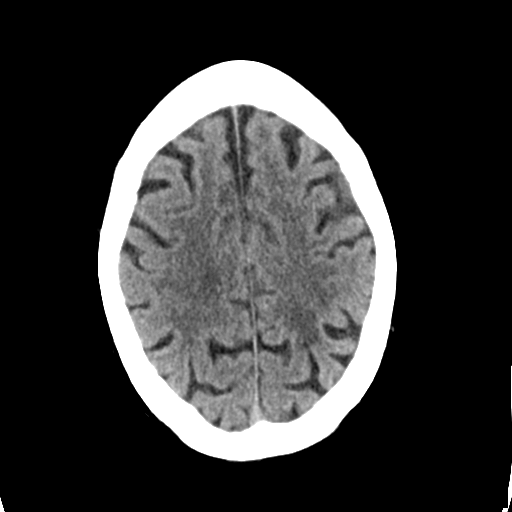
[im 22/31  brain]
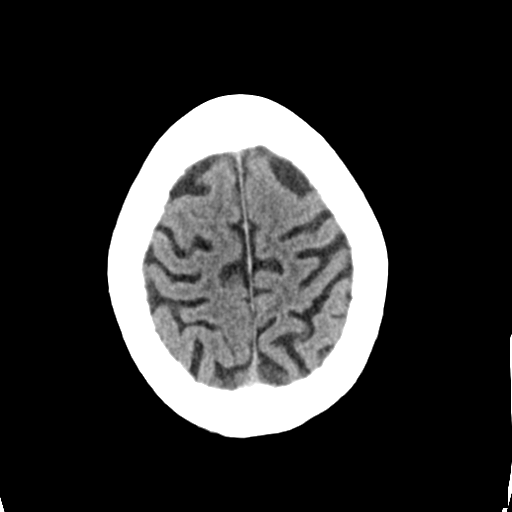
[im 25/31  brain]
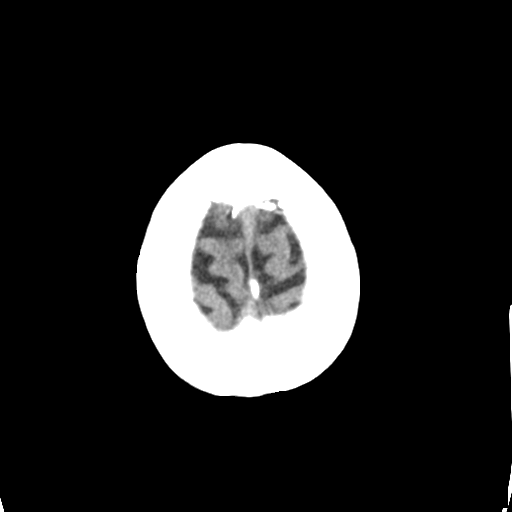
[im 28/31  brain]
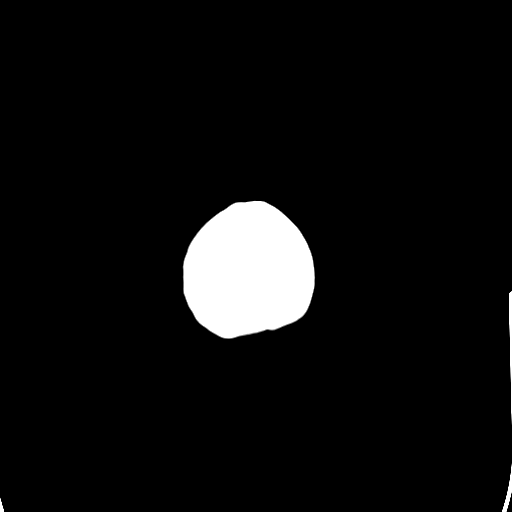
[im 28/31  bone]
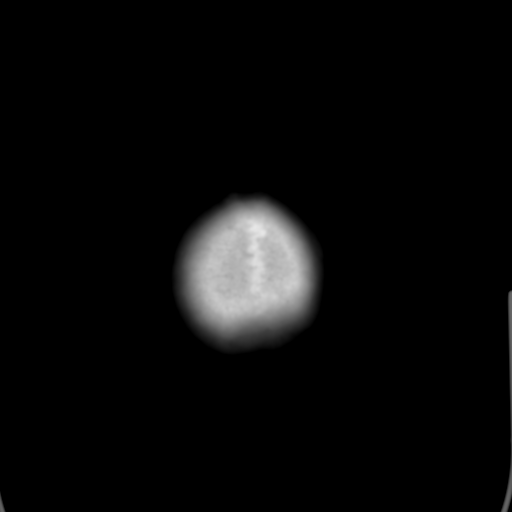

[Series 4: coronal soft tissue · coronal · 0.36mm/px · 3 of 70 slices shown]
[im 24/70  brain]
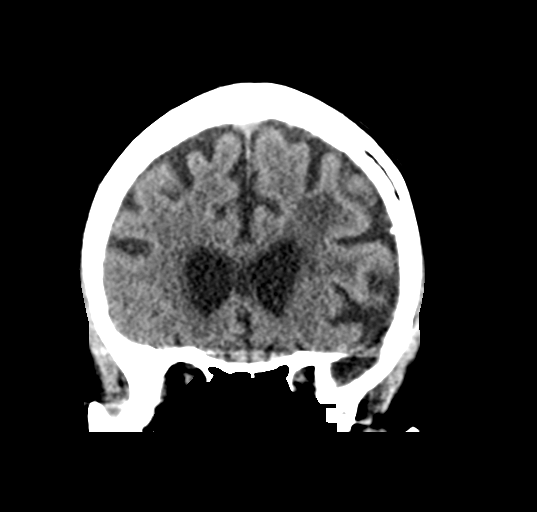
[im 31/70  brain]
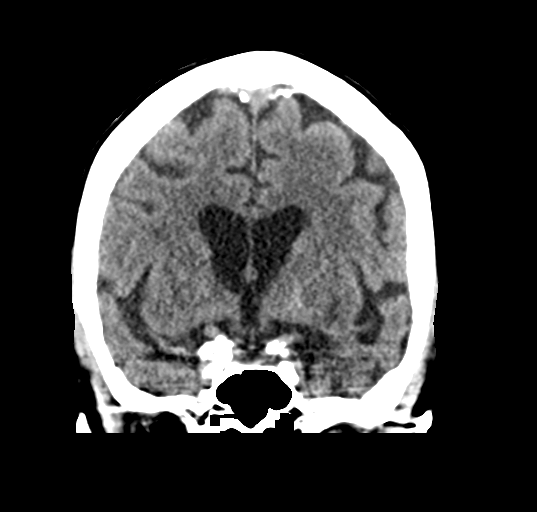
[im 39/70  brain]
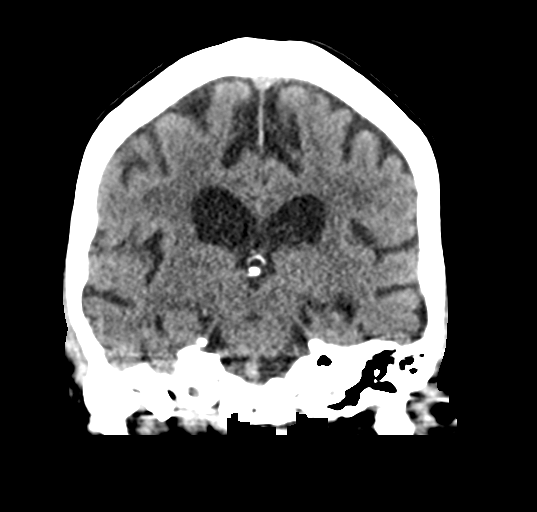

[Series 5: sagittal soft tissue · sagittal · 0.32mm/px · 3 of 53 slices shown]
[im 18/53  brain]
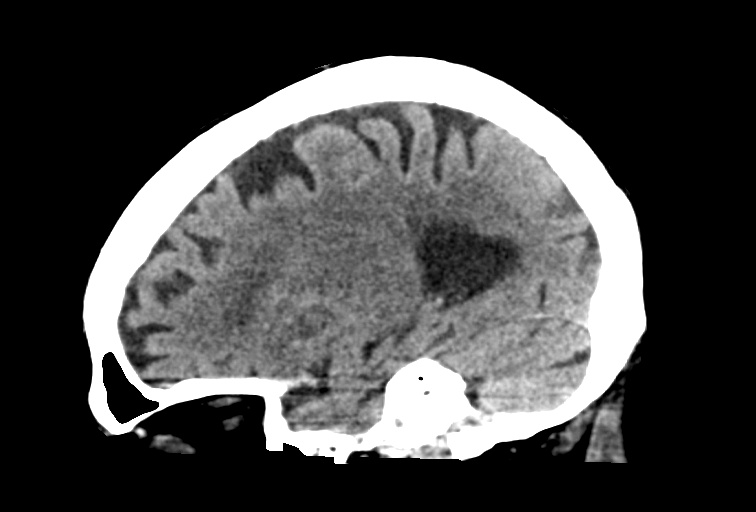
[im 27/53  brain]
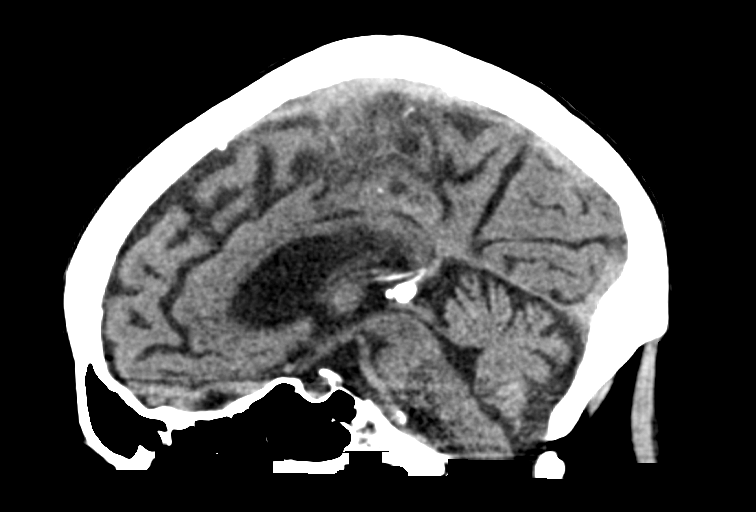
[im 35/53  brain]
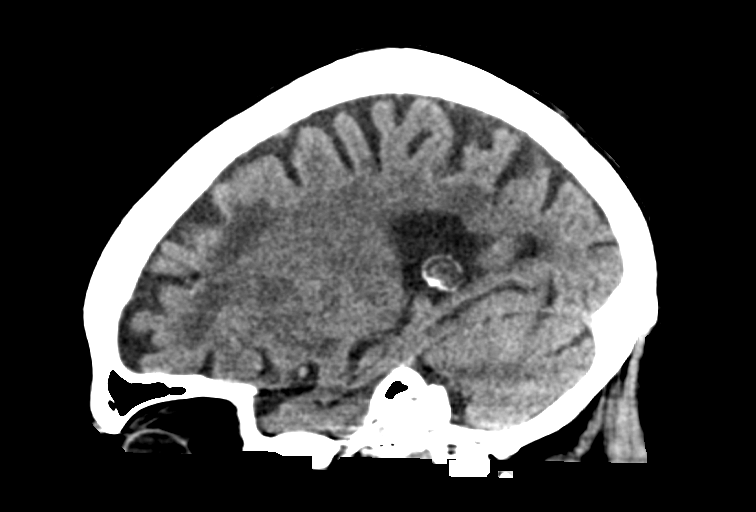

[15 of 47 positions shown; findings below may reference images not displayed]

FINDINGS: Brain: Ventricles and cisterns are within normal. There is mild age
related atrophic change. There is chronic ischemic microvascular
disease. No mass, mass effect, shift of midline structures or acute
hemorrhage. No evidence of acute infarction.

Vascular: No hyperdense vessel or unexpected calcification.

Skull: Normal. Negative for fracture or focal lesion.

Sinuses/Orbits: No acute finding.

Other: None.
IMPRESSION: No acute findings.

Chronic ischemic microvascular disease and age related atrophic
change.

## 2018-10-30 DIAGNOSIS — N390 Urinary tract infection, site not specified: Secondary | ICD-10-CM | POA: Diagnosis not present

## 2018-10-31 DIAGNOSIS — Z79899 Other long term (current) drug therapy: Secondary | ICD-10-CM | POA: Diagnosis not present

## 2018-11-04 ENCOUNTER — Ambulatory Visit: Payer: Medicare Other | Admitting: Family

## 2018-12-05 DIAGNOSIS — I10 Essential (primary) hypertension: Secondary | ICD-10-CM | POA: Diagnosis not present

## 2018-12-05 DIAGNOSIS — G20 Parkinson's disease: Secondary | ICD-10-CM | POA: Diagnosis not present

## 2018-12-05 DIAGNOSIS — E039 Hypothyroidism, unspecified: Secondary | ICD-10-CM | POA: Diagnosis not present

## 2018-12-27 DIAGNOSIS — B351 Tinea unguium: Secondary | ICD-10-CM | POA: Diagnosis not present

## 2018-12-27 DIAGNOSIS — I739 Peripheral vascular disease, unspecified: Secondary | ICD-10-CM | POA: Diagnosis not present

## 2019-01-02 DIAGNOSIS — E039 Hypothyroidism, unspecified: Secondary | ICD-10-CM | POA: Diagnosis not present

## 2019-01-02 DIAGNOSIS — G20 Parkinson's disease: Secondary | ICD-10-CM | POA: Diagnosis not present

## 2019-01-02 DIAGNOSIS — I10 Essential (primary) hypertension: Secondary | ICD-10-CM | POA: Diagnosis not present

## 2019-01-07 DIAGNOSIS — D518 Other vitamin B12 deficiency anemias: Secondary | ICD-10-CM | POA: Diagnosis not present

## 2019-01-07 DIAGNOSIS — E119 Type 2 diabetes mellitus without complications: Secondary | ICD-10-CM | POA: Diagnosis not present

## 2019-01-07 DIAGNOSIS — E038 Other specified hypothyroidism: Secondary | ICD-10-CM | POA: Diagnosis not present

## 2019-01-10 DIAGNOSIS — R109 Unspecified abdominal pain: Secondary | ICD-10-CM | POA: Diagnosis not present

## 2019-01-23 DIAGNOSIS — E119 Type 2 diabetes mellitus without complications: Secondary | ICD-10-CM | POA: Diagnosis not present

## 2019-01-23 DIAGNOSIS — E559 Vitamin D deficiency, unspecified: Secondary | ICD-10-CM | POA: Diagnosis not present

## 2019-01-23 DIAGNOSIS — D518 Other vitamin B12 deficiency anemias: Secondary | ICD-10-CM | POA: Diagnosis not present

## 2019-01-23 DIAGNOSIS — E038 Other specified hypothyroidism: Secondary | ICD-10-CM | POA: Diagnosis not present

## 2019-01-23 DIAGNOSIS — Z79899 Other long term (current) drug therapy: Secondary | ICD-10-CM | POA: Diagnosis not present

## 2019-01-23 DIAGNOSIS — E7849 Other hyperlipidemia: Secondary | ICD-10-CM | POA: Diagnosis not present

## 2019-01-30 DIAGNOSIS — G20 Parkinson's disease: Secondary | ICD-10-CM | POA: Diagnosis not present

## 2019-01-30 DIAGNOSIS — E039 Hypothyroidism, unspecified: Secondary | ICD-10-CM | POA: Diagnosis not present

## 2019-01-30 DIAGNOSIS — I10 Essential (primary) hypertension: Secondary | ICD-10-CM | POA: Diagnosis not present

## 2019-02-19 DIAGNOSIS — E038 Other specified hypothyroidism: Secondary | ICD-10-CM | POA: Diagnosis not present

## 2019-02-19 DIAGNOSIS — E119 Type 2 diabetes mellitus without complications: Secondary | ICD-10-CM | POA: Diagnosis not present

## 2019-02-19 DIAGNOSIS — D518 Other vitamin B12 deficiency anemias: Secondary | ICD-10-CM | POA: Diagnosis not present

## 2019-02-25 ENCOUNTER — Telehealth: Payer: Self-pay | Admitting: Family

## 2019-02-27 DIAGNOSIS — I739 Peripheral vascular disease, unspecified: Secondary | ICD-10-CM | POA: Diagnosis not present

## 2019-02-27 DIAGNOSIS — I10 Essential (primary) hypertension: Secondary | ICD-10-CM | POA: Diagnosis not present

## 2019-02-27 DIAGNOSIS — E876 Hypokalemia: Secondary | ICD-10-CM | POA: Diagnosis not present

## 2019-03-27 DIAGNOSIS — I10 Essential (primary) hypertension: Secondary | ICD-10-CM | POA: Diagnosis not present

## 2019-03-27 DIAGNOSIS — I739 Peripheral vascular disease, unspecified: Secondary | ICD-10-CM | POA: Diagnosis not present

## 2019-03-27 DIAGNOSIS — G20 Parkinson's disease: Secondary | ICD-10-CM | POA: Diagnosis not present

## 2019-03-31 DIAGNOSIS — D518 Other vitamin B12 deficiency anemias: Secondary | ICD-10-CM | POA: Diagnosis not present

## 2019-03-31 DIAGNOSIS — E119 Type 2 diabetes mellitus without complications: Secondary | ICD-10-CM | POA: Diagnosis not present

## 2019-03-31 DIAGNOSIS — E038 Other specified hypothyroidism: Secondary | ICD-10-CM | POA: Diagnosis not present

## 2019-04-17 DIAGNOSIS — E038 Other specified hypothyroidism: Secondary | ICD-10-CM | POA: Diagnosis not present

## 2019-04-17 DIAGNOSIS — L209 Atopic dermatitis, unspecified: Secondary | ICD-10-CM | POA: Diagnosis not present

## 2019-04-17 DIAGNOSIS — E119 Type 2 diabetes mellitus without complications: Secondary | ICD-10-CM | POA: Diagnosis not present

## 2019-04-17 DIAGNOSIS — E7849 Other hyperlipidemia: Secondary | ICD-10-CM | POA: Diagnosis not present

## 2019-04-17 DIAGNOSIS — Z79899 Other long term (current) drug therapy: Secondary | ICD-10-CM | POA: Diagnosis not present

## 2019-04-17 DIAGNOSIS — D518 Other vitamin B12 deficiency anemias: Secondary | ICD-10-CM | POA: Diagnosis not present

## 2019-04-17 DIAGNOSIS — E559 Vitamin D deficiency, unspecified: Secondary | ICD-10-CM | POA: Diagnosis not present

## 2019-04-24 DIAGNOSIS — I10 Essential (primary) hypertension: Secondary | ICD-10-CM | POA: Diagnosis not present

## 2019-04-24 DIAGNOSIS — E039 Hypothyroidism, unspecified: Secondary | ICD-10-CM | POA: Diagnosis not present

## 2019-04-24 DIAGNOSIS — I739 Peripheral vascular disease, unspecified: Secondary | ICD-10-CM | POA: Diagnosis not present

## 2019-05-09 DIAGNOSIS — B351 Tinea unguium: Secondary | ICD-10-CM | POA: Diagnosis not present

## 2019-05-09 DIAGNOSIS — L84 Corns and callosities: Secondary | ICD-10-CM | POA: Diagnosis not present

## 2019-05-09 DIAGNOSIS — I739 Peripheral vascular disease, unspecified: Secondary | ICD-10-CM | POA: Diagnosis not present

## 2019-05-14 DIAGNOSIS — E119 Type 2 diabetes mellitus without complications: Secondary | ICD-10-CM | POA: Diagnosis not present

## 2019-05-14 DIAGNOSIS — E038 Other specified hypothyroidism: Secondary | ICD-10-CM | POA: Diagnosis not present

## 2019-05-14 DIAGNOSIS — D518 Other vitamin B12 deficiency anemias: Secondary | ICD-10-CM | POA: Diagnosis not present

## 2019-05-17 ENCOUNTER — Telehealth: Payer: Self-pay | Admitting: Family Medicine

## 2019-05-17 NOTE — Telephone Encounter (Signed)
**  [facility] After Hours/ Emergency Line Call**  Patient: Katherine Roth.  PCP: Sharion Balloon, FNP Patient's nursing home calls to inform that patient sustained a fall onto her bottom this morning.  They note this happens often and they are reaching out to on call provider as protocol.  Denies hitting her head.  She is acting her normal self.  Does not appear to be in pain.  Vitals are normal.   Recommend monitoring closely.  Red flags discussed.  Will forward to PCP.  Ashly M. Lajuana Ripple, DO

## 2019-06-05 DIAGNOSIS — I10 Essential (primary) hypertension: Secondary | ICD-10-CM | POA: Diagnosis not present

## 2019-06-05 DIAGNOSIS — G20 Parkinson's disease: Secondary | ICD-10-CM | POA: Diagnosis not present

## 2019-06-18 DIAGNOSIS — E038 Other specified hypothyroidism: Secondary | ICD-10-CM | POA: Diagnosis not present

## 2019-06-18 DIAGNOSIS — E119 Type 2 diabetes mellitus without complications: Secondary | ICD-10-CM | POA: Diagnosis not present

## 2019-06-18 DIAGNOSIS — D518 Other vitamin B12 deficiency anemias: Secondary | ICD-10-CM | POA: Diagnosis not present

## 2019-07-03 DIAGNOSIS — G20 Parkinson's disease: Secondary | ICD-10-CM | POA: Diagnosis not present

## 2019-07-03 DIAGNOSIS — E039 Hypothyroidism, unspecified: Secondary | ICD-10-CM | POA: Diagnosis not present

## 2019-07-03 DIAGNOSIS — I10 Essential (primary) hypertension: Secondary | ICD-10-CM | POA: Diagnosis not present

## 2019-07-03 DIAGNOSIS — I739 Peripheral vascular disease, unspecified: Secondary | ICD-10-CM | POA: Diagnosis not present

## 2019-07-23 DIAGNOSIS — E038 Other specified hypothyroidism: Secondary | ICD-10-CM | POA: Diagnosis not present

## 2019-07-23 DIAGNOSIS — E119 Type 2 diabetes mellitus without complications: Secondary | ICD-10-CM | POA: Diagnosis not present

## 2019-07-23 DIAGNOSIS — D518 Other vitamin B12 deficiency anemias: Secondary | ICD-10-CM | POA: Diagnosis not present

## 2019-07-24 DIAGNOSIS — E119 Type 2 diabetes mellitus without complications: Secondary | ICD-10-CM | POA: Diagnosis not present

## 2019-07-24 DIAGNOSIS — G301 Alzheimer's disease with late onset: Secondary | ICD-10-CM | POA: Diagnosis not present

## 2019-07-24 DIAGNOSIS — E559 Vitamin D deficiency, unspecified: Secondary | ICD-10-CM | POA: Diagnosis not present

## 2019-07-24 DIAGNOSIS — Z79899 Other long term (current) drug therapy: Secondary | ICD-10-CM | POA: Diagnosis not present

## 2019-07-24 DIAGNOSIS — G20 Parkinson's disease: Secondary | ICD-10-CM | POA: Diagnosis not present

## 2019-07-24 DIAGNOSIS — D518 Other vitamin B12 deficiency anemias: Secondary | ICD-10-CM | POA: Diagnosis not present

## 2019-07-24 DIAGNOSIS — E038 Other specified hypothyroidism: Secondary | ICD-10-CM | POA: Diagnosis not present

## 2019-07-24 DIAGNOSIS — E7849 Other hyperlipidemia: Secondary | ICD-10-CM | POA: Diagnosis not present

## 2019-08-07 DIAGNOSIS — E7849 Other hyperlipidemia: Secondary | ICD-10-CM | POA: Diagnosis not present

## 2019-08-07 DIAGNOSIS — Z79899 Other long term (current) drug therapy: Secondary | ICD-10-CM | POA: Diagnosis not present

## 2019-08-07 DIAGNOSIS — E038 Other specified hypothyroidism: Secondary | ICD-10-CM | POA: Diagnosis not present

## 2019-08-07 DIAGNOSIS — D518 Other vitamin B12 deficiency anemias: Secondary | ICD-10-CM | POA: Diagnosis not present

## 2019-08-07 DIAGNOSIS — E119 Type 2 diabetes mellitus without complications: Secondary | ICD-10-CM | POA: Diagnosis not present

## 2019-08-11 DIAGNOSIS — B351 Tinea unguium: Secondary | ICD-10-CM | POA: Diagnosis not present

## 2019-08-19 DIAGNOSIS — E119 Type 2 diabetes mellitus without complications: Secondary | ICD-10-CM | POA: Diagnosis not present

## 2019-08-19 DIAGNOSIS — D518 Other vitamin B12 deficiency anemias: Secondary | ICD-10-CM | POA: Diagnosis not present

## 2019-08-19 DIAGNOSIS — E038 Other specified hypothyroidism: Secondary | ICD-10-CM | POA: Diagnosis not present

## 2019-08-21 DIAGNOSIS — I10 Essential (primary) hypertension: Secondary | ICD-10-CM | POA: Diagnosis not present

## 2019-08-21 DIAGNOSIS — E039 Hypothyroidism, unspecified: Secondary | ICD-10-CM | POA: Diagnosis not present

## 2019-08-21 DIAGNOSIS — G20 Parkinson's disease: Secondary | ICD-10-CM | POA: Diagnosis not present

## 2019-08-21 DIAGNOSIS — E440 Moderate protein-calorie malnutrition: Secondary | ICD-10-CM | POA: Diagnosis not present

## 2019-08-27 ENCOUNTER — Other Ambulatory Visit: Payer: Self-pay

## 2019-08-27 NOTE — Patient Outreach (Signed)
[facility]) Care Management  08/27/2019  Katherine Roth 17-May-1933 HR:[id number]   Medication Adherence call to Katherine Roth Telephone call to Patient regarding Medication Adherence unable to reach patient,Katherine Roth is at an Assistance Living patient is showing past due on Rosuvastatin 5 mg under [place].   [facility] Management Direct Dial [phone]  Fax [phone] [email or web address]

## 2019-09-09 DIAGNOSIS — E119 Type 2 diabetes mellitus without complications: Secondary | ICD-10-CM | POA: Diagnosis not present

## 2019-09-09 DIAGNOSIS — D518 Other vitamin B12 deficiency anemias: Secondary | ICD-10-CM | POA: Diagnosis not present

## 2019-09-09 DIAGNOSIS — E038 Other specified hypothyroidism: Secondary | ICD-10-CM | POA: Diagnosis not present

## 2019-09-09 DIAGNOSIS — E559 Vitamin D deficiency, unspecified: Secondary | ICD-10-CM | POA: Diagnosis not present

## 2019-09-22 DIAGNOSIS — U071 COVID-19: Secondary | ICD-10-CM | POA: Diagnosis not present

## 2019-10-10 DEATH — deceased
